# Patient Record
Sex: Female | Born: 1975
Health system: Southern US, Community
[De-identification: ages and names within clinical notes are randomized; demographics above are authoritative.]

## PROBLEM LIST (undated history)

## (undated) DIAGNOSIS — N879 Dysplasia of cervix uteri, unspecified: Secondary | ICD-10-CM

## (undated) DIAGNOSIS — K219 Gastro-esophageal reflux disease without esophagitis: Secondary | ICD-10-CM

## (undated) DIAGNOSIS — E039 Hypothyroidism, unspecified: Secondary | ICD-10-CM

## (undated) DIAGNOSIS — B977 Papillomavirus as the cause of diseases classified elsewhere: Secondary | ICD-10-CM

## (undated) DIAGNOSIS — O99345 Other mental disorders complicating the puerperium: Secondary | ICD-10-CM

## (undated) DIAGNOSIS — F53 Postpartum depression: Secondary | ICD-10-CM

## (undated) HISTORY — DX: Dysplasia of cervix uteri, unspecified: N87.9

## (undated) HISTORY — DX: Other mental disorders complicating the puerperium: O99.345

## (undated) HISTORY — DX: Papillomavirus as the cause of diseases classified elsewhere: B97.7

## (undated) HISTORY — DX: Postpartum depression: F53.0

## (undated) HISTORY — DX: Gastro-esophageal reflux disease without esophagitis: K21.9

## (undated) HISTORY — PX: WISDOM TOOTH EXTRACTION: SHX21

## (undated) HISTORY — DX: Hypothyroidism, unspecified: E03.9

---

## 2015-04-20 ENCOUNTER — Encounter: Payer: Self-pay | Admitting: Obstetrics & Gynecology

## 2015-04-20 ENCOUNTER — Ambulatory Visit (INDEPENDENT_AMBULATORY_CARE_PROVIDER_SITE_OTHER): Payer: 59 | Admitting: Obstetrics & Gynecology

## 2015-04-20 VITALS — BP 114/79 | HR 68 | Resp 16 | Ht 72.0 in | Wt 161.0 lb

## 2015-04-20 DIAGNOSIS — Z01419 Encounter for gynecological examination (general) (routine) without abnormal findings: Secondary | ICD-10-CM | POA: Diagnosis not present

## 2015-04-20 DIAGNOSIS — Z124 Encounter for screening for malignant neoplasm of cervix: Secondary | ICD-10-CM | POA: Diagnosis not present

## 2015-04-20 DIAGNOSIS — Z1151 Encounter for screening for human papillomavirus (HPV): Secondary | ICD-10-CM

## 2015-04-20 DIAGNOSIS — Z0000 Encounter for general adult medical examination without abnormal findings: Secondary | ICD-10-CM

## 2015-04-20 NOTE — Progress Notes (Signed)
Subjective:    Chelsea Lindsey is a 39 y.o. MW P2(3 and 43 yo kids) female who presents for an annual exam. The patient has no complaints today. Her period is starting to return. She is weaning her baby. The patient is sexually active. GYN screening history: last pap: was abnormal: ?Marland Kitchen The patient wears seatbelts: yes. The patient participates in regular exercise: yes. Has the patient ever been transfused or tattooed?: no. The patient reports that there is not domestic violence in her life.   Menstrual History: OB History    Gravida Para Term Preterm AB TAB SAB Ectopic Multiple Living   _0 Menarche age: 41  No LMP recorded. Patient is not currently having periods (Reason: Lactating).    The following portions of the patient's history were reviewed and updated as appropriate: allergies, current medications, past family history, past medical history, past social history, past surgical history and problem list.  Review of Systems A comprehensive review of systems was negative. Married for 4 years, feels some discomfort with sex since birth of second vaginal delivery. Some dryness and uses [employer] brand lube. 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 Strong FH of cancers She had a colpo 9/15 at [facility] in [place], just moved here. Her husband is the stroke English as a second language teacher.   Objective:    BP 114/79 mmHg  Pulse 68  Resp 16  Ht 6' (1.829 m)  Wt 161 lb (73.029 kg)  BMI 21.83 kg/m2  General Appearance:    Alert, cooperative, no distress, appears stated age  Head:    Normocephalic, without obvious abnormality, atraumatic  Eyes:    PERRL, conjunctiva/corneas clear, EOM's intact, fundi    benign, both eyes  Ears:    Normal TM's and external ear canals, both ears  Nose:   Nares normal, septum midline, mucosa normal, no drainage    or sinus tenderness  Throat:   Lips, mucosa, and tongue normal; teeth and gums normal  Neck:   Supple, symmetrical, trachea midline, no adenopathy;    thyroid:  no  enlargement/tenderness/nodules; no carotid   bruit or JVD  Back:     Symmetric, no curvature, ROM normal, no CVA tenderness  Lungs:     Clear to auscultation bilaterally, respirations unlabored  Chest Wall:    No tenderness or deformity   Heart:    Regular rate and rhythm, S1 and S2 normal, no murmur, rub   or gallop  Breast Exam:    No tenderness, masses, or nipple abnormality  Abdomen:     Soft, non-tender, bowel sounds active all four quadrants,    no masses, no organomegaly  Genitalia:    Normal female without lesion, discharge or tenderness, NSSA, NT, mobile, no prolapse, normal adnexal exam     Extremities:   Extremities normal, atraumatic, no cyanosis or edema  Pulses:   2+ and symmetric all extremities  Skin:   Skin color, texture, turgor normal, no rashes or lesions  Lymph nodes:   Cervical, supraclavicular, and axillary nodes normal  Neurologic:   CNII-XII intact, normal strength, sensation and reflexes    throughout  .    Assessment:    Healthy female exam.    Plan:     Breast self exam technique reviewed and patient encouraged to perform self-exam monthly. Thin prep Pap smear. with cotesting My Risk by [facility]

## 2015-04-25 LAB — CYTOLOGY - PAP

## 2015-04-27 ENCOUNTER — Encounter: Payer: Self-pay | Admitting: Internal Medicine

## 2015-04-27 ENCOUNTER — Other Ambulatory Visit (INDEPENDENT_AMBULATORY_CARE_PROVIDER_SITE_OTHER): Payer: 59

## 2015-04-27 ENCOUNTER — Ambulatory Visit (INDEPENDENT_AMBULATORY_CARE_PROVIDER_SITE_OTHER): Payer: 59 | Admitting: Internal Medicine

## 2015-04-27 VITALS — BP 100/60 | HR 72 | Temp 97.5°F | Resp 12 | Ht 72.0 in | Wt 168.4 lb

## 2015-04-27 DIAGNOSIS — Z0000 Encounter for general adult medical examination without abnormal findings: Secondary | ICD-10-CM

## 2015-04-27 DIAGNOSIS — F53 Postpartum depression: Secondary | ICD-10-CM

## 2015-04-27 DIAGNOSIS — E039 Hypothyroidism, unspecified: Secondary | ICD-10-CM

## 2015-04-27 DIAGNOSIS — O99345 Other mental disorders complicating the puerperium: Secondary | ICD-10-CM

## 2015-04-27 LAB — LIPID PANEL
Cholesterol: 196 mg/dL (ref 0–200)
HDL: 81.3 mg/dL (ref >39.00)
LDL Cholesterol: 104 mg/dL — ABNORMAL HIGH (ref 0–99)
NONHDL: 114.69
Total CHOL/HDL Ratio: 2
Triglycerides: 55 mg/dL (ref 0.0–149.0)
VLDL: 11 mg/dL (ref 0.0–40.0)

## 2015-04-27 LAB — COMPREHENSIVE METABOLIC PANEL
ALBUMIN: 4.5 g/dL (ref 3.5–5.2)
ALT: 11 U/L (ref 0–35)
AST: 17 U/L (ref 0–37)
Alkaline Phosphatase: 69 U/L (ref 39–117)
BILIRUBIN TOTAL: 0.4 mg/dL (ref 0.2–1.2)
BUN: 14 mg/dL (ref 6–23)
CHLORIDE: 103 meq/L (ref 96–112)
CO2: 30 mEq/L (ref 19–32)
CREATININE: 0.75 mg/dL (ref 0.40–1.20)
Calcium: 9.1 mg/dL (ref 8.4–10.5)
GFR: 92.06 mL/min (ref >60.00)
Glucose, Bld: 78 mg/dL (ref 70–99)
POTASSIUM: 4 meq/L (ref 3.5–5.1)
Sodium: 138 mEq/L (ref 135–145)
TOTAL PROTEIN: 7.1 g/dL (ref 6.0–8.3)

## 2015-04-27 LAB — TSH: TSH: 2.92 u[IU]/mL (ref 0.35–4.50)

## 2015-04-27 LAB — T4, FREE: Free T4: 0.78 ng/dL (ref 0.60–1.60)

## 2015-04-27 MED ORDER — SERTRALINE HCL 50 MG PO TABS: 50.0000 mg | ORAL_TABLET | Freq: Every day | ORAL | Status: DC

## 2015-04-27 MED ORDER — LEVOTHYROXINE SODIUM 100 MCG PO TABS: 100.0000 ug | ORAL_TABLET | Freq: Every day | ORAL | Status: DC

## 2015-04-27 NOTE — Progress Notes (Signed)
Pre visit review using our clinic review tool, if applicable. No additional management support is needed unless otherwise documented below in the visit note. 

## 2015-04-27 NOTE — Patient Instructions (Signed)
We have sent in the refills and will check the blood work today. We will call you back with the results even if everything is normal.  Come back in about 1 year for a check up. If you have any problems or questions before then please feel free to call us back.  Health Maintenance Adopting a healthy lifestyle and getting preventive care can go a long way to promote health and wellness. Talk with your health care provider about what schedule of regular examinations is right for you. This is a good chance for you to check in with your provider about disease prevention and staying healthy. In between checkups, there are plenty of things you can do on your own. Experts have done a lot of research about which lifestyle changes and preventive measures are most likely to keep you healthy. Ask your health care provider for more information. WEIGHT AND DIET  Eat a healthy diet  Be sure to include plenty of vegetables, fruits, low-fat dairy products, and lean protein.  Do not eat a lot of foods high in solid fats, added sugars, or salt.  Get regular exercise. This is one of the most important things you can do for your health.  Most adults should exercise for at least 150 minutes each week. The exercise should increase your heart rate and make you sweat (moderate-intensity exercise).  Most adults should also do strengthening exercises at least twice a week. This is in addition to the moderate-intensity exercise.  Maintain a healthy weight  Body mass index (BMI) is a measurement that can be used to identify possible weight problems. It estimates body fat based on height and weight. Your health care provider can help determine your BMI and help you achieve or maintain a healthy weight.  For females 27 years of age and older:   A BMI below 18.5 is considered underweight.  A BMI of 18.5 to 24.9 is normal.  A BMI of 25 to 29.9 is considered overweight.  A BMI of 30 and above is considered obese.   Watch levels of cholesterol and blood lipids  You should start having your blood tested for lipids and cholesterol at 38 years of age, then have this test every 5 years.  You may need to have your cholesterol levels checked more often if:  Your lipid or cholesterol levels are high.  You are older than 38 years of age.  You are at high risk for heart disease.  CANCER SCREENING   Lung Cancer  Lung cancer screening is recommended for adults 25-85 years old who are at high risk for lung cancer because of a history of smoking.  A yearly low-dose CT scan of the lungs is recommended for people who:  Currently smoke.  Have quit within the past 15 years.  Have at least a 30-pack-year history of smoking. A pack year is smoking an average of one pack of cigarettes a day for 1 year.  Yearly screening should continue until it has been 15 years since you quit.  Yearly screening should stop if you develop a health problem that would prevent you from having lung cancer treatment.  Breast Cancer  Practice breast self-awareness. This means understanding how your breasts normally appear and feel.  It also means doing regular breast self-exams. Let your health care provider know about any changes, no matter how small.  If you are in your 20s or 30s, you should have a clinical breast exam (CBE) by a health care provider  every 1-3 years as part of a regular health exam.  If you are 40 or older, have a CBE every year. Also consider having a breast X-ray (mammogram) every year.  If you have a family history of breast cancer, talk to your health care provider about genetic screening.  If you are at high risk for breast cancer, talk to your health care provider about having an MRI and a mammogram every year.  Breast cancer gene (BRCA) assessment is recommended for women who have family members with BRCA-related cancers. BRCA-related cancers  include:  Breast.  Ovarian.  Tubal.  Peritoneal cancers.  Results of the assessment will determine the need for genetic counseling and BRCA1 and BRCA2 testing. Cervical Cancer Routine pelvic examinations to screen for cervical cancer are no longer recommended for nonpregnant women who are considered low risk for cancer of the pelvic organs (ovaries, uterus, and vagina) and who do not have symptoms. A pelvic examination may be necessary if you have symptoms including those associated with pelvic infections. Ask your health care provider if a screening pelvic exam is right for you.   The Pap test is the screening test for cervical cancer for women who are considered at risk.  If you had a hysterectomy for a problem that was not cancer or a condition that could lead to cancer, then you no longer need Pap tests.  If you are older than 65 years, and you have had normal Pap tests for the past 10 years, you no longer need to have Pap tests.  If you have had past treatment for cervical cancer or a condition that could lead to cancer, you need Pap tests and screening for cancer for at least 20 years after your treatment.  If you no longer get a Pap test, assess your risk factors if they change (such as having a new sexual partner). This can affect whether you should start being screened again.  Some women have medical problems that increase their chance of getting cervical cancer. If this is the case for you, your health care provider may recommend more frequent screening and Pap tests.  The human papillomavirus (HPV) test is another test that may be used for cervical cancer screening. The HPV test looks for the virus that can cause cell changes in the cervix. The cells collected during the Pap test can be tested for HPV.  The HPV test can be used to screen women 30 years of age and older. Getting tested for HPV can extend the interval between normal Pap tests from three to five years.  An HPV  test also should be used to screen women of any age who have unclear Pap test results.  After 38 years of age, women should have HPV testing as often as Pap tests.  Colorectal Cancer  This type of cancer can be detected and often prevented.  Routine colorectal cancer screening usually begins at 38 years of age and continues through 38 years of age.  Your health care provider may recommend screening at an earlier age if you have risk factors for colon cancer.  Your health care provider may also recommend using home test kits to check for hidden blood in the stool.  A small camera at the end of a tube can be used to examine your colon directly (sigmoidoscopy or colonoscopy). This is done to check for the earliest forms of colorectal cancer.  Routine screening usually begins at age 50.  Direct examination of the colon should   be repeated every 5-10 years through 38 years of age. However, you may need to be screened more often if early forms of precancerous polyps or small growths are found. Skin Cancer  Check your skin from head to toe regularly.  Tell your health care provider about any new moles or changes in moles, especially if there is a change in a mole's shape or color.  Also tell your health care provider if you have a mole that is larger than the size of a pencil eraser.  Always use sunscreen. Apply sunscreen liberally and repeatedly throughout the day.  Protect yourself by wearing long sleeves, pants, a wide-brimmed hat, and sunglasses whenever you are outside. HEART DISEASE, DIABETES, AND HIGH BLOOD PRESSURE   Have your blood pressure checked at least every 1-2 years. High blood pressure causes heart disease and increases the risk of stroke.  If you are between 55 years and 79 years old, ask your health care provider if you should take aspirin to prevent strokes.  Have regular diabetes screenings. This involves taking a blood sample to check your fasting blood sugar  level.  If you are at a normal weight and have a low risk for diabetes, have this test once every three years after 38 years of age.  If you are overweight and have a high risk for diabetes, consider being tested at a younger age or more often. PREVENTING INFECTION  Hepatitis B  If you have a higher risk for hepatitis B, you should be screened for this virus. You are considered at high risk for hepatitis B if:  You were born in a country where hepatitis B is common. Ask your health care provider which countries are considered high risk.  Your parents were born in a high-risk country, and you have not been immunized against hepatitis B (hepatitis B vaccine).  You have HIV or AIDS.  You use needles to inject street drugs.  You live with someone who has hepatitis B.  You have had sex with someone who has hepatitis B.  You get hemodialysis treatment.  You take certain medicines for conditions, including cancer, organ transplantation, and autoimmune conditions. Hepatitis C  Blood testing is recommended for:  Everyone born from 1945 through 1965.  Anyone with known risk factors for hepatitis C. Sexually transmitted infections (STIs)  You should be screened for sexually transmitted infections (STIs) including gonorrhea and chlamydia if:  You are sexually active and are younger than 38 years of age.  You are older than 38 years of age and your health care provider tells you that you are at risk for this type of infection.  Your sexual activity has changed since you were last screened and you are at an increased risk for chlamydia or gonorrhea. Ask your health care provider if you are at risk.  If you do not have HIV, but are at risk, it may be recommended that you take a prescription medicine daily to prevent HIV infection. This is called pre-exposure prophylaxis (PrEP). You are considered at risk if:  You are sexually active and do not regularly use condoms or know the HIV status  of your partner(s).  You take drugs by injection.  You are sexually active with a partner who has HIV. Talk with your health care provider about whether you are at high risk of being infected with HIV. If you choose to begin PrEP, you should first be tested for HIV. You should then be tested every 3 months for as   long as you are taking PrEP.  PREGNANCY   If you are premenopausal and you may become pregnant, ask your health care provider about preconception counseling.  If you may become pregnant, take 400 to 800 micrograms (mcg) of folic acid every day.  If you want to prevent pregnancy, talk to your health care provider about birth control (contraception). OSTEOPOROSIS AND MENOPAUSE   Osteoporosis is a disease in which the bones lose minerals and strength with aging. This can result in serious bone fractures. Your risk for osteoporosis can be identified using a bone density scan.  If you are 5 years of age or older, or if you are at risk for osteoporosis and fractures, ask your health care provider if you should be screened.  Ask your health care provider whether you should take a calcium or vitamin D supplement to lower your risk for osteoporosis.  Menopause may have certain physical symptoms and risks.  Hormone replacement therapy may reduce some of these symptoms and risks. Talk to your health care provider about whether hormone replacement therapy is right for you.  HOME CARE INSTRUCTIONS   Schedule regular health, dental, and eye exams.  Stay current with your immunizations.   Do not use any tobacco products including cigarettes, chewing tobacco, or electronic cigarettes.  If you are pregnant, do not drink alcohol.  If you are breastfeeding, limit how much and how often you drink alcohol.  Limit alcohol intake to no more than 1 drink per day for nonpregnant women. One drink equals 12 ounces of beer, 5 ounces of wine, or 1 ounces of hard liquor.  Do not use street  drugs.  Do not share needles.  Ask your health care provider for help if you need support or information about quitting drugs.  Tell your health care provider if you often feel depressed.  Tell your health care provider if you have ever been abused or do not feel safe at home. Document Released: 03/26/2011 Document Revised: 01/25/2014 Document Reviewed: 08/12/2013 [facility] Patient Information 2015 [place], [place]. This information is not intended to replace advice given to you by your health care provider. Make sure you discuss any questions you have with your health care provider.

## 2015-04-28 ENCOUNTER — Encounter: Payer: Self-pay | Admitting: Internal Medicine

## 2015-04-28 DIAGNOSIS — F53 Postpartum depression: Secondary | ICD-10-CM | POA: Insufficient documentation

## 2015-04-28 DIAGNOSIS — O99345 Other mental disorders complicating the puerperium: Secondary | ICD-10-CM

## 2015-04-28 DIAGNOSIS — E039 Hypothyroidism, unspecified: Secondary | ICD-10-CM | POA: Insufficient documentation

## 2015-04-28 MED ORDER — HYDROCORTISONE 2.5 % RE CREA: 1.0000 "application " | TOPICAL_CREAM | Freq: Two times a day (BID) | RECTAL | Status: DC

## 2015-04-28 NOTE — Assessment & Plan Note (Signed)
Continue levothyroxine for now, check TSH and free T4 and adjust dose as needed.

## 2015-04-28 NOTE — Assessment & Plan Note (Signed)
Fairly severe after 1st child, initiated zoloft right at birth of second child and has done well. Will continue for now and reassess next year if still needed.

## 2015-04-28 NOTE — Progress Notes (Signed)
   Subjective:    Patient ID: Chelsea Lindsey, female    DOB: 05-May-1976, 39 y.o.   MRN: [id number]  HPI The patient is a 39 YO female coming in for follow up on her thyroid. She has been on medication for several years. The dosage was adjusted during her first pregnancy and since then she has been on 100 mcg daily. No adjustment needed during her recent 2nd pregnancy. She denies tremors, diarrhea, constipation, fatigue. No weight change.   PMH, [facility], social history reviewed and updated.   Review of Systems  Constitutional: Negative for fever, activity change, appetite change and fatigue.  HENT: Negative.   Eyes: Negative.   Respiratory: Negative for cough, chest tightness, shortness of breath and wheezing.   Cardiovascular: Negative for chest pain, palpitations and leg swelling.  Gastrointestinal: Negative for nausea, abdominal pain, diarrhea, constipation and abdominal distention.  Musculoskeletal: Negative.   Skin: Negative.   Neurological: Negative.   Psychiatric/Behavioral: Negative.       Objective:   Physical Exam  Constitutional: She is oriented to person, place, and time. She appears well-developed and well-nourished.  HENT:  Head: Normocephalic and atraumatic.  Eyes: EOM are normal.  Neck: Normal range of motion.  Cardiovascular: Normal rate and regular rhythm.   Pulmonary/Chest: Effort normal. No respiratory distress. She has no wheezes. She has no rales.  Abdominal: Soft. Bowel sounds are normal. She exhibits no distension. There is no tenderness. There is no rebound.  Musculoskeletal: She exhibits no edema.  Neurological: She is alert and oriented to person, place, and time. Coordination normal.  Skin: Skin is warm and dry.  Psychiatric: She has a normal mood and affect.   Filed Vitals:   04/27/15 1110  BP: 100/60  Pulse: 72  Temp: 97.5 F (36.4 C)  TempSrc: Oral  Resp: 12  Height: 6' (1.829 m)  Weight: 168 lb 6.4 oz (76.386 kg)  SpO2: 98%      Assessment &  Plan:

## 2015-05-09 ENCOUNTER — Encounter: Payer: Self-pay | Admitting: *Deleted

## 2015-05-15 ENCOUNTER — Encounter: Payer: Self-pay | Admitting: Internal Medicine

## 2015-05-16 MED ORDER — LEVOTHYROXINE SODIUM 100 MCG PO TABS: 100.0000 ug | ORAL_TABLET | Freq: Every day | ORAL | Status: DC

## 2015-06-16 ENCOUNTER — Ambulatory Visit: Payer: Self-pay | Admitting: Obstetrics and Gynecology

## 2015-07-28 ENCOUNTER — Ambulatory Visit (INDEPENDENT_AMBULATORY_CARE_PROVIDER_SITE_OTHER): Payer: 59 | Admitting: Internal Medicine

## 2015-07-28 ENCOUNTER — Encounter: Payer: Self-pay | Admitting: Internal Medicine

## 2015-07-28 VITALS — BP 110/78 | HR 91 | Temp 98.5°F | Resp 12 | Ht 72.0 in | Wt 174.8 lb

## 2015-07-28 DIAGNOSIS — B370 Candidal stomatitis: Secondary | ICD-10-CM

## 2015-07-28 MED ORDER — FLUCONAZOLE 150 MG PO TABS: 150.0000 mg | ORAL_TABLET | ORAL | Status: DC

## 2015-07-28 NOTE — Patient Instructions (Signed)
I have sent in the diflucan for the yeast infection. Take 1 pill today, then take another pill on Sunday and the 3rd pill on Wednesday.   This should take care of the yeast, the taste buds may take a couple of weeks to heal and get you back to normal.

## 2015-07-28 NOTE — Progress Notes (Signed)
Pre visit review using our clinic review tool, if applicable. No additional management support is needed unless otherwise documented below in the visit note. 

## 2015-07-28 NOTE — Progress Notes (Signed)
   Subjective:    Patient ID: Chelsea Lindsey, female    DOB: 11/11/1975, 39 y.o.   MRN: [id number]  HPI The patient is a 39 YO female coming in today for yeast infection. Her son is having yeast infection and thrush and does put his hand in her mouth and she is concerned she has thrush as well. White plaque on her tongue that is hard to scrape off. Some change to her taste sensation as well. No fevers or chills. Not getting a cold and denies sinus drainage. Going on for 3-4 days and has tried hydrogen peroxide gargling which did not help.   Review of Systems  Constitutional: Negative.   HENT: Negative for congestion, mouth sores, sore throat, trouble swallowing and voice change.        Plaque on tongue and change in taste  Eyes: Negative.   Respiratory: Negative.   Cardiovascular: Negative.   Gastrointestinal: Negative.   Musculoskeletal: Negative.       Objective:   Physical Exam  Constitutional: She is oriented to person, place, and time. She appears well-developed and well-nourished.  HENT:  Head: Normocephalic and atraumatic.  No plaque on the tongue noticeable, no sores in the mouth.  Eyes: EOM are normal.  Neck: Normal range of motion.  Cardiovascular: Normal rate and regular rhythm.   Pulmonary/Chest: Effort normal and breath sounds normal. No respiratory distress. She has no wheezes.  Abdominal: Soft. Bowel sounds are normal. She exhibits no distension. There is no tenderness. There is no rebound.  Neurological: She is alert and oriented to person, place, and time.  Skin: Skin is warm and dry.   Filed Vitals:   07/28/15 0806  BP: 110/78  Pulse: 91  Temp: 98.5 F (36.9 C)  TempSrc: Oral  Resp: 12  Height: 6' (1.829 m)  Weight: 174 lb 12.8 oz (79.289 kg)  SpO2: 99%      Assessment & Plan:

## 2015-07-28 NOTE — Assessment & Plan Note (Signed)
It does not appear on exam that she does have thrush. Could have damaged her taste buds with the hydrogen peroxide gargle and did advise her to not continue that. Rx for fluconazole oral times 3 for the yeast infection.

## 2015-08-11 ENCOUNTER — Other Ambulatory Visit (INDEPENDENT_AMBULATORY_CARE_PROVIDER_SITE_OTHER): Payer: 59

## 2015-08-11 ENCOUNTER — Ambulatory Visit (INDEPENDENT_AMBULATORY_CARE_PROVIDER_SITE_OTHER): Payer: 59 | Admitting: Family

## 2015-08-11 ENCOUNTER — Encounter: Payer: Self-pay | Admitting: Family

## 2015-08-11 VITALS — BP 124/72 | HR 75 | Temp 98.0°F | Resp 18 | Ht 72.0 in | Wt 179.8 lb

## 2015-08-11 DIAGNOSIS — R432 Parageusia: Secondary | ICD-10-CM | POA: Diagnosis not present

## 2015-08-11 DIAGNOSIS — K121 Other forms of stomatitis: Secondary | ICD-10-CM

## 2015-08-11 LAB — VITAMIN B12: Vitamin B-12: 649 pg/mL (ref 211–911)

## 2015-08-11 LAB — CBC
HEMATOCRIT: 36.6 % (ref 36.0–46.0)
Hemoglobin: 12.3 g/dL (ref 12.0–15.0)
MCHC: 33.6 g/dL (ref 30.0–36.0)
MCV: 87.3 fl (ref 78.0–100.0)
PLATELETS: 180 10*3/uL (ref 150.0–400.0)
RBC: 4.19 Mil/uL (ref 3.87–5.11)
RDW: 13 % (ref 11.5–15.5)
WBC: 4.3 10*3/uL (ref 4.0–10.5)

## 2015-08-11 MED ORDER — LIDOCAINE VISCOUS 2 % MT SOLN: 15.0000 mL | OROMUCOSAL | Status: DC | PRN

## 2015-08-11 NOTE — Progress Notes (Signed)
Pre visit review using our clinic review tool, if applicable. No additional management support is needed unless otherwise documented below in the visit note. 

## 2015-08-11 NOTE — Assessment & Plan Note (Signed)
Taste impairment of undetermined origin. No coatings or obvious changes noted in the tongue. Obtain B12 to rule out glossitis. Refer to ENT for further assessment.

## 2015-08-11 NOTE — Progress Notes (Signed)
   Subjective:    Patient ID: Chelsea Lindsey, female    DOB: 04-Apr-1976, 39 y.o.   MRN: [id number]  Chief Complaint  Patient presents with  . Sore Throat    x1 month, the sxs from last visit are consistent and now has sore throat and an ulcer and having pain in gums    HPI:  Chelsea Lindsey is a 39 y.o. female who  has a past medical history of Cervical dysplasia; Hypothyroid; HPV in female; Post partum depression; and GERD (gastroesophageal reflux disease). and presents today for an acute office visit.   Recently seen in the office for potential thrush and treated with fluconazole. Presents today with similar symptoms from previous visit with white coating on her tongue, sore throat, a small ulcer and pain in her gums. She describes it as a burnt tongue feeling with an ulcer located on the right side of her mouth and gum sensitivity.  Denies fevers.   No Known Allergies   Current Outpatient Prescriptions on File Prior to Visit  Medication Sig Dispense Refill  . hydrocortisone (ANUSOL-HC) 2.5 % rectal cream Place 1 application rectally 2 (two) times daily. 30 g 0  . levothyroxine (SYNTHROID, LEVOTHROID) 100 MCG tablet Take 1 tablet (100 mcg total) by mouth daily before breakfast. No generics 90 tablet 3  . sertraline (ZOLOFT) 50 MG tablet Take 1 tablet (50 mg total) by mouth daily. 90 tablet 3   No current facility-administered medications on file prior to visit.    Review of Systems  Constitutional: Negative for fever and chills.  HENT: Positive for dental problem.       Objective:    BP 124/72 mmHg  Pulse 75  Temp(Src) 98 F (36.7 C) (Oral)  Resp 18  Ht 6' (1.829 m)  Wt 179 lb 12.8 oz (81.557 kg)  BMI 24.38 kg/m2  SpO2 99% Nursing note and vital signs reviewed.  Physical Exam  Constitutional: She is oriented to person, place, and time. She appears well-developed and well-nourished. No distress.  HENT:  Mouth/Throat:    No obvious gum inflammation or bleeding noted. Tongue  with no obvious deformity, edema or discoloration. No masses or bleeding.   Cardiovascular: Normal rate, regular rhythm, normal heart sounds and intact distal pulses.   Pulmonary/Chest: Effort normal and breath sounds normal.  Neurological: She is alert and oriented to person, place, and time.  Skin: Skin is warm and dry.  Psychiatric: She has a normal mood and affect. Her behavior is normal. Judgment and thought content normal.       Assessment & Plan:   Problem List Items Addressed This Visit      Digestive   Oral ulcer    Oral ulcer noted right upper cheek. Start lidocaine as needed for discomfort. No further treatment is needed at this time. Follow-up if symptoms worsen or fail to improve.      Relevant Medications   lidocaine (XYLOCAINE) 2 % solution     Other   Taste impairment - Primary    Taste impairment of undetermined origin. No coatings or obvious changes noted in the tongue. Obtain B12 to rule out glossitis. Refer to ENT for further assessment.      Relevant Orders   Ambulatory referral to ENT   CBC   B12   Methylmalonic Acid

## 2015-08-11 NOTE — Patient Instructions (Signed)
Thank you for choosing [facility].  Summary/Instructions:  Your prescription(s) have been submitted to your pharmacy or been printed and provided for you. Please take as directed and contact our office if you believe you are having problem(s) with the medication(s) or have any questions.  Please stop by the lab on the basement level of the building for your blood work. Your results will be released to MyChart (or called to you) after review, usually within 72 hours after test completion. If any changes need to be made, you will be notified at that same time.  If your symptoms worsen or fail to improve, please contact our office for further instruction, or in case of emergency go directly to the emergency room at the closest medical facility.     

## 2015-08-11 NOTE — Assessment & Plan Note (Signed)
Oral ulcer noted right upper cheek. Start lidocaine as needed for discomfort. No further treatment is needed at this time. Follow-up if symptoms worsen or fail to improve.

## 2015-08-14 LAB — METHYLMALONIC ACID, SERUM: METHYLMALONIC ACID, QUANT: 107 nmol/L (ref 87–318)

## 2015-08-15 ENCOUNTER — Ambulatory Visit: Payer: Self-pay | Admitting: Internal Medicine

## 2015-08-15 ENCOUNTER — Encounter: Payer: Self-pay | Admitting: Family

## 2015-08-31 ENCOUNTER — Other Ambulatory Visit (INDEPENDENT_AMBULATORY_CARE_PROVIDER_SITE_OTHER): Payer: 59

## 2015-08-31 ENCOUNTER — Encounter: Payer: Self-pay | Admitting: Internal Medicine

## 2015-08-31 ENCOUNTER — Ambulatory Visit (INDEPENDENT_AMBULATORY_CARE_PROVIDER_SITE_OTHER): Payer: 59 | Admitting: Internal Medicine

## 2015-08-31 VITALS — BP 118/72 | HR 68 | Temp 98.3°F | Resp 14 | Ht 72.0 in | Wt 180.0 lb

## 2015-08-31 DIAGNOSIS — E039 Hypothyroidism, unspecified: Secondary | ICD-10-CM | POA: Diagnosis not present

## 2015-08-31 LAB — TSH: TSH: 3.33 u[IU]/mL (ref 0.35–4.50)

## 2015-08-31 LAB — FERRITIN: FERRITIN: 10.2 ng/mL (ref 10.0–291.0)

## 2015-08-31 LAB — T4, FREE: Free T4: 0.69 ng/dL (ref 0.60–1.60)

## 2015-08-31 NOTE — Progress Notes (Signed)
Pre visit review using our clinic review tool, if applicable. No additional management support is needed unless otherwise documented below in the visit note. 

## 2015-08-31 NOTE — Assessment & Plan Note (Signed)
Having worsening symptoms, checking TSH and free T4 and adjust as needed. Continue synthroid 100 mcg daily for now.

## 2015-08-31 NOTE — Patient Instructions (Signed)
We are going to recheck the thyroid levels today and will get you the results likely tomorrow.

## 2015-08-31 NOTE — Progress Notes (Signed)
   Subjective:    Patient ID: Chelsea Lindsey, female    DOB: [birth year], 39 y.o.   MRN: [id number]  HPI The patient is a 39 YO female coming in for new tiredness, weight gain, and dry skin. Her hair is also falling out a little more lately. She has been taking thyroid medicine for some time and has still been taking the medicine. No other new complaints. Still having some trouble with some tongue ulcers. Taste is back to normal.  Review of Systems  Constitutional: Positive for fever. Negative for activity change, appetite change and fatigue.  HENT: Negative.        Hair falling out but dry skin  Eyes: Negative.   Respiratory: Negative for cough, chest tightness, shortness of breath and wheezing.   Cardiovascular: Negative for chest pain, palpitations and leg swelling.  Gastrointestinal: Negative for nausea, abdominal pain, diarrhea, constipation and abdominal distention.  Musculoskeletal: Negative.   Skin: Negative.   Neurological: Negative.   Psychiatric/Behavioral: Negative.       Objective:   Physical Exam  Constitutional: She is oriented to person, place, and time. She appears well-developed and well-nourished.  HENT:  Head: Normocephalic and atraumatic.  Eyes: EOM are normal.  Neck: Normal range of motion.  Cardiovascular: Normal rate and regular rhythm.   Pulmonary/Chest: Effort normal. No respiratory distress. She has no wheezes. She has no rales.  Abdominal: Soft. Bowel sounds are normal. She exhibits no distension. There is no tenderness. There is no rebound.  Musculoskeletal: She exhibits no edema.  Neurological: She is alert and oriented to person, place, and time. Coordination normal.  Skin: Skin is warm and dry.  Psychiatric: She has a normal mood and affect.   Filed Vitals:   08/31/15 0817  BP: 118/72  Pulse: 68  Temp: 98.3 F (36.8 C)  TempSrc: Oral  Resp: 14  Height: 6' (1.829 m)  Weight: 180 lb (81.647 kg)  SpO2: 100%      Assessment & Plan:

## 2015-09-01 ENCOUNTER — Ambulatory Visit: Payer: Self-pay | Admitting: Internal Medicine

## 2015-09-26 ENCOUNTER — Other Ambulatory Visit (HOSPITAL_BASED_OUTPATIENT_CLINIC_OR_DEPARTMENT_OTHER): Payer: Self-pay | Admitting: Physician Assistant

## 2015-09-26 ENCOUNTER — Ambulatory Visit (HOSPITAL_BASED_OUTPATIENT_CLINIC_OR_DEPARTMENT_OTHER)
Admission: RE | Admit: 2015-09-26 | Discharge: 2015-09-26 | Disposition: A | Discharge status: Home / Self Care | Payer: 59 | Source: Ambulatory Visit | Attending: Physician Assistant | Admitting: Physician Assistant

## 2015-09-26 DIAGNOSIS — M5412 Radiculopathy, cervical region: Secondary | ICD-10-CM | POA: Diagnosis not present

## 2015-09-26 DIAGNOSIS — M542 Cervicalgia: Secondary | ICD-10-CM | POA: Diagnosis not present

## 2015-11-10 MED FILL — SYNTHROID 100 MCG TABLET: 100 | 90 days supply | Qty: 90 | Fill #2

## 2015-12-21 ENCOUNTER — Encounter: Payer: Self-pay | Admitting: Obstetrics & Gynecology

## 2015-12-21 ENCOUNTER — Ambulatory Visit (INDEPENDENT_AMBULATORY_CARE_PROVIDER_SITE_OTHER): Payer: 59 | Admitting: Obstetrics & Gynecology

## 2015-12-21 VITALS — BP 117/80 | HR 95 | Ht 72.0 in | Wt 177.0 lb

## 2015-12-21 DIAGNOSIS — N898 Other specified noninflammatory disorders of vagina: Secondary | ICD-10-CM

## 2015-12-21 NOTE — Progress Notes (Signed)
   Subjective:    Patient ID: Chelsea Lindsey, female    DOB: [birth year], 40 y.o.   MRN: [id number]  HPI 40 yo MWP2 (both toddlers)  lady here with the complaint of excess vaginal discharge. Sometimes it is yellowish, greenish, sometimes like a sinus infection. She denies any itching or odor.  Review of Systems Her husband had a vasectomy. She is an OT at [employer]  She already had her flu vaccine. Objective:   Physical Exam  [id number] Breathing, conversing, and ambulating normally Clear sticky vaginal d/c c/w ovulation     Assessment & Plan:  Annoying discharge- wet prep sent Normal d/c pattern discussed

## 2015-12-22 LAB — WET PREP, GENITAL
Clue Cells Wet Prep HPF POC: NONE SEEN
TRICH WET PREP: NONE SEEN
Yeast Wet Prep HPF POC: NONE SEEN

## 2016-01-20 DIAGNOSIS — D225 Melanocytic nevi of trunk: Secondary | ICD-10-CM | POA: Diagnosis not present

## 2016-01-20 DIAGNOSIS — D2261 Melanocytic nevi of right upper limb, including shoulder: Secondary | ICD-10-CM | POA: Diagnosis not present

## 2016-01-20 DIAGNOSIS — L814 Other melanin hyperpigmentation: Secondary | ICD-10-CM | POA: Diagnosis not present

## 2016-01-20 DIAGNOSIS — L813 Cafe au lait spots: Secondary | ICD-10-CM | POA: Diagnosis not present

## 2016-01-20 DIAGNOSIS — D692 Other nonthrombocytopenic purpura: Secondary | ICD-10-CM | POA: Diagnosis not present

## 2016-01-20 DIAGNOSIS — D2271 Melanocytic nevi of right lower limb, including hip: Secondary | ICD-10-CM | POA: Diagnosis not present

## 2016-01-20 DIAGNOSIS — D2262 Melanocytic nevi of left upper limb, including shoulder: Secondary | ICD-10-CM | POA: Diagnosis not present

## 2016-01-20 DIAGNOSIS — L821 Other seborrheic keratosis: Secondary | ICD-10-CM | POA: Diagnosis not present

## 2016-02-02 MED FILL — SYNTHROID 100 MCG TABLET: 100 | 90 days supply | Qty: 90 | Fill #3

## 2016-04-06 ENCOUNTER — Ambulatory Visit: Payer: 59 | Admitting: Internal Medicine

## 2016-04-12 ENCOUNTER — Other Ambulatory Visit: Payer: Self-pay | Admitting: Internal Medicine

## 2016-04-12 MED FILL — SYNTHROID 100 MCG TABLET: 100 | 90 days supply | Qty: 90 | Fill #0

## 2016-04-26 IMAGING — CR DG CERVICAL SPINE COMPLETE 4+V
6 series · 6 of 6 positions shown · non-contrast
Comparison: 06/29/2015

CLINICAL DATA: 39-year-old female with acute cervical spine pain
today. No known injury.

EXAM:
CERVICAL SPINE - COMPLETE 4+ VIEW

[w c-spine lat]
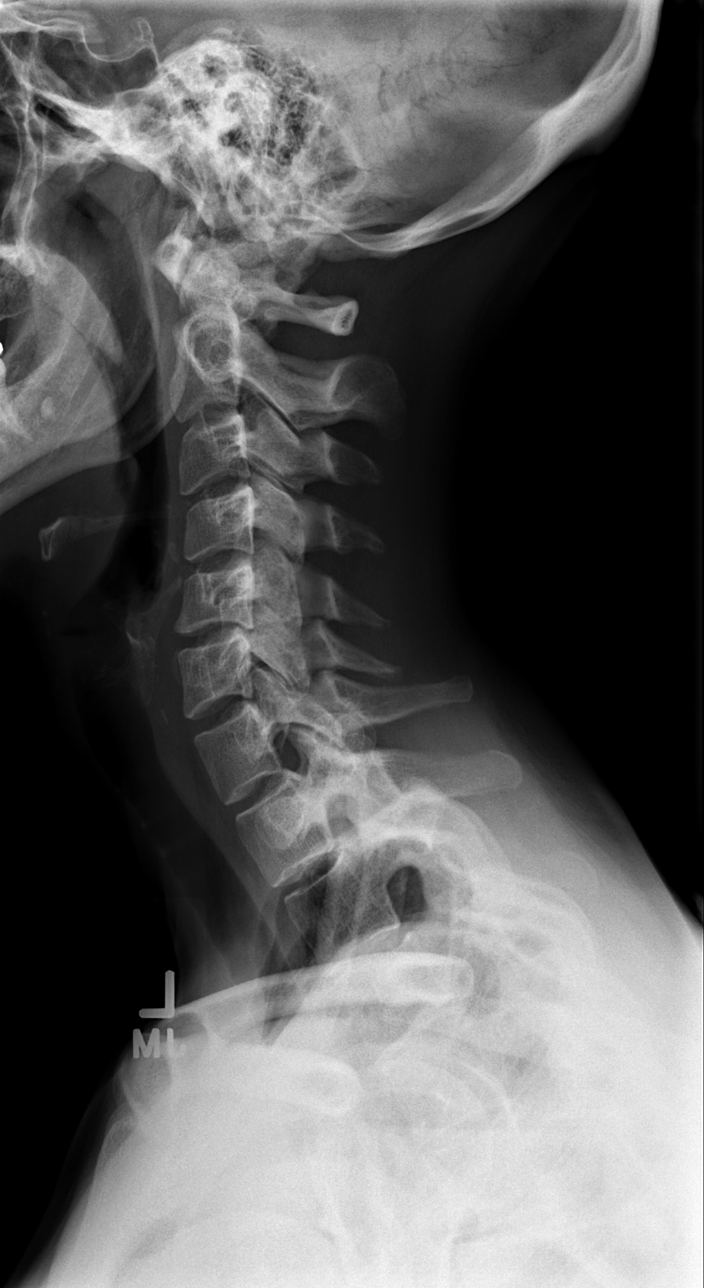

[w c-spine oblique (1 of 2)]
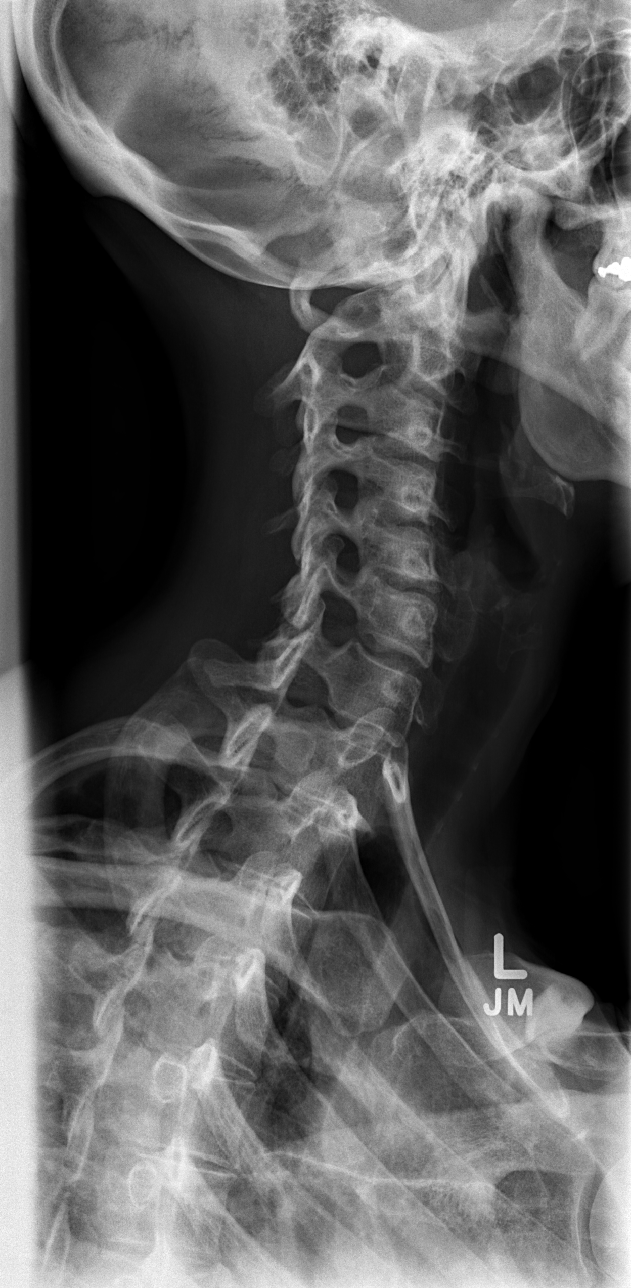

[w c-spine oblique (2 of 2)]
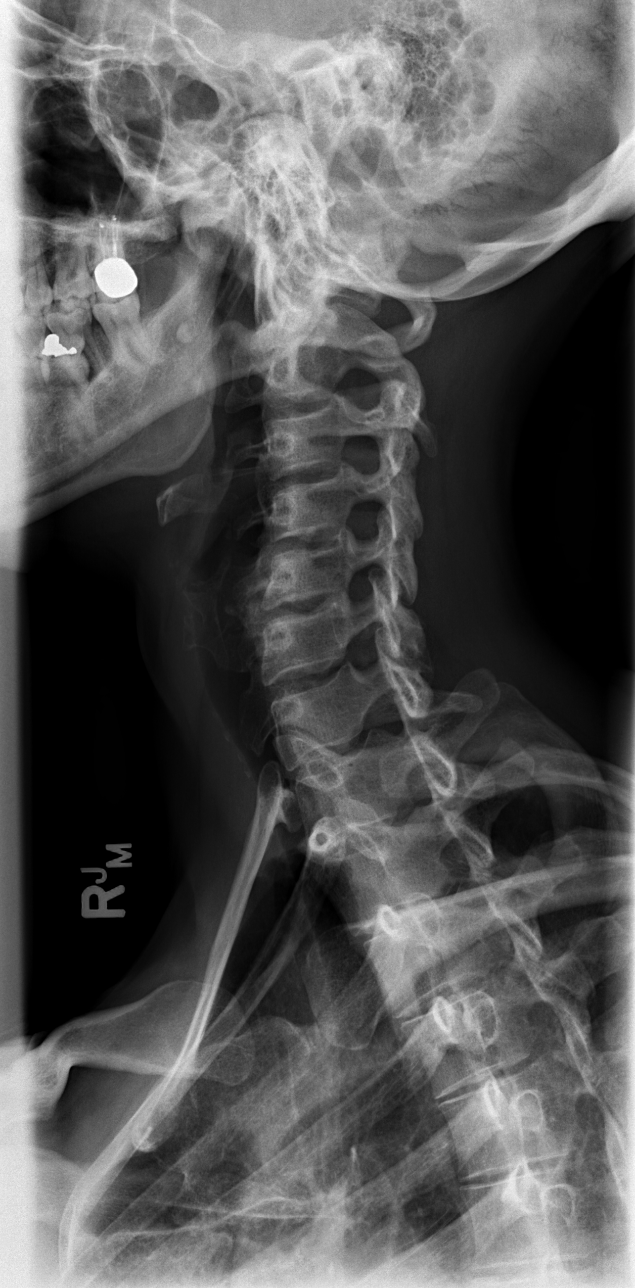

[w c-spine a.p.]
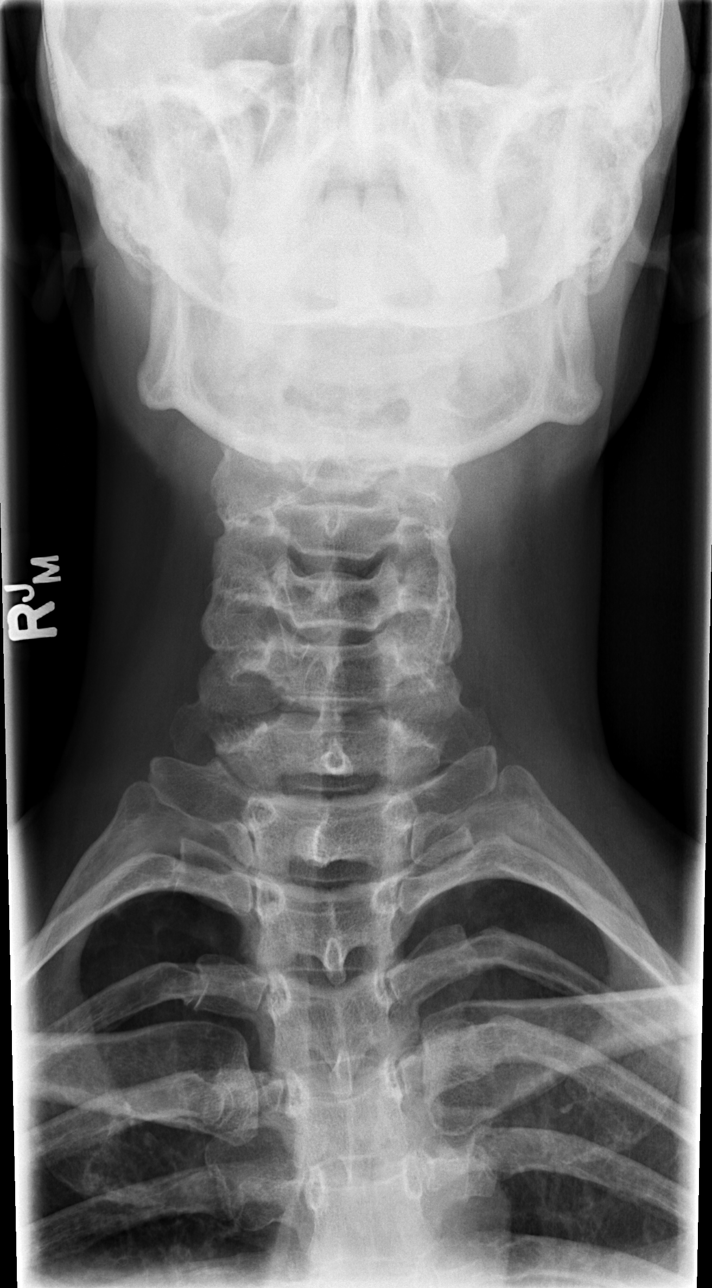

[w c-spine odontoid (1 of 2)]
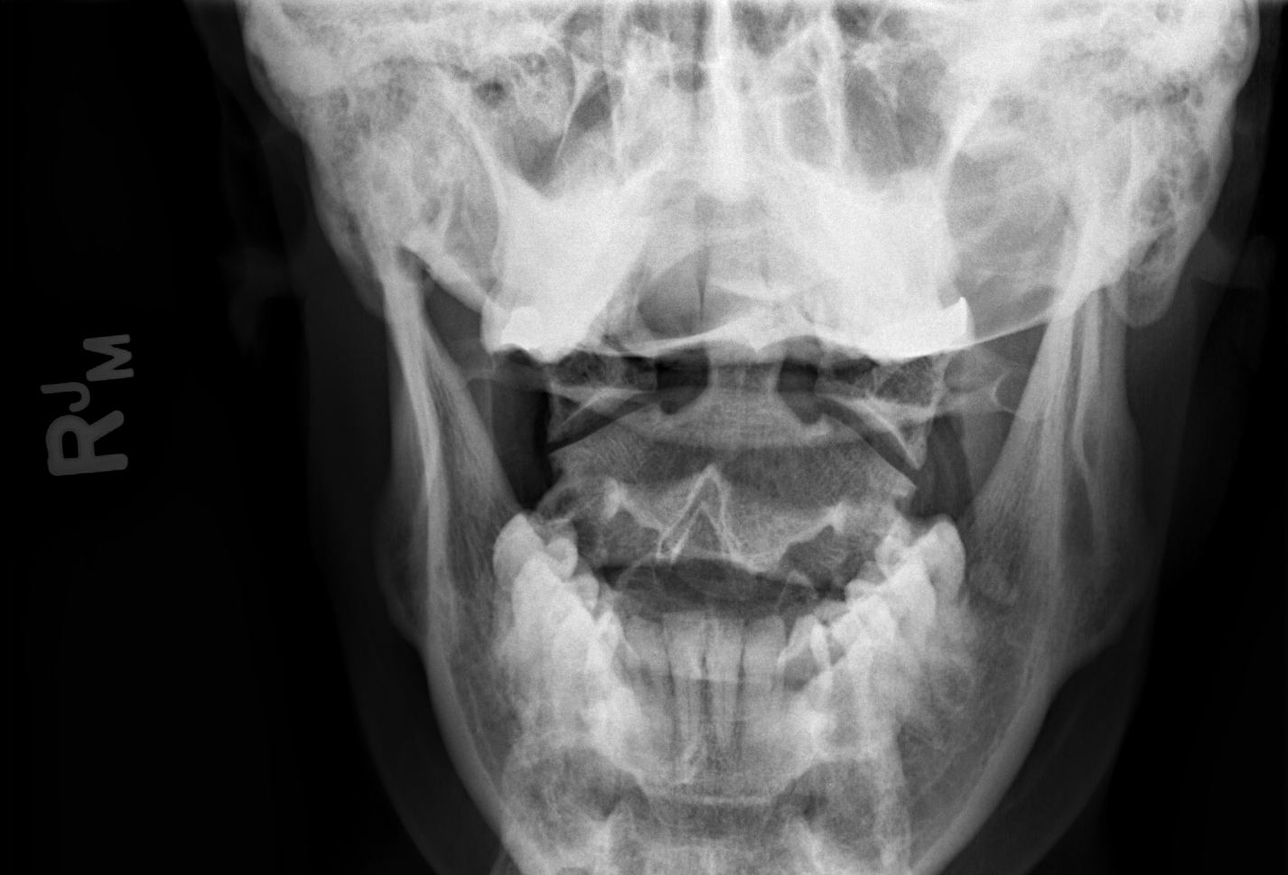

[w c-spine odontoid (2 of 2)]
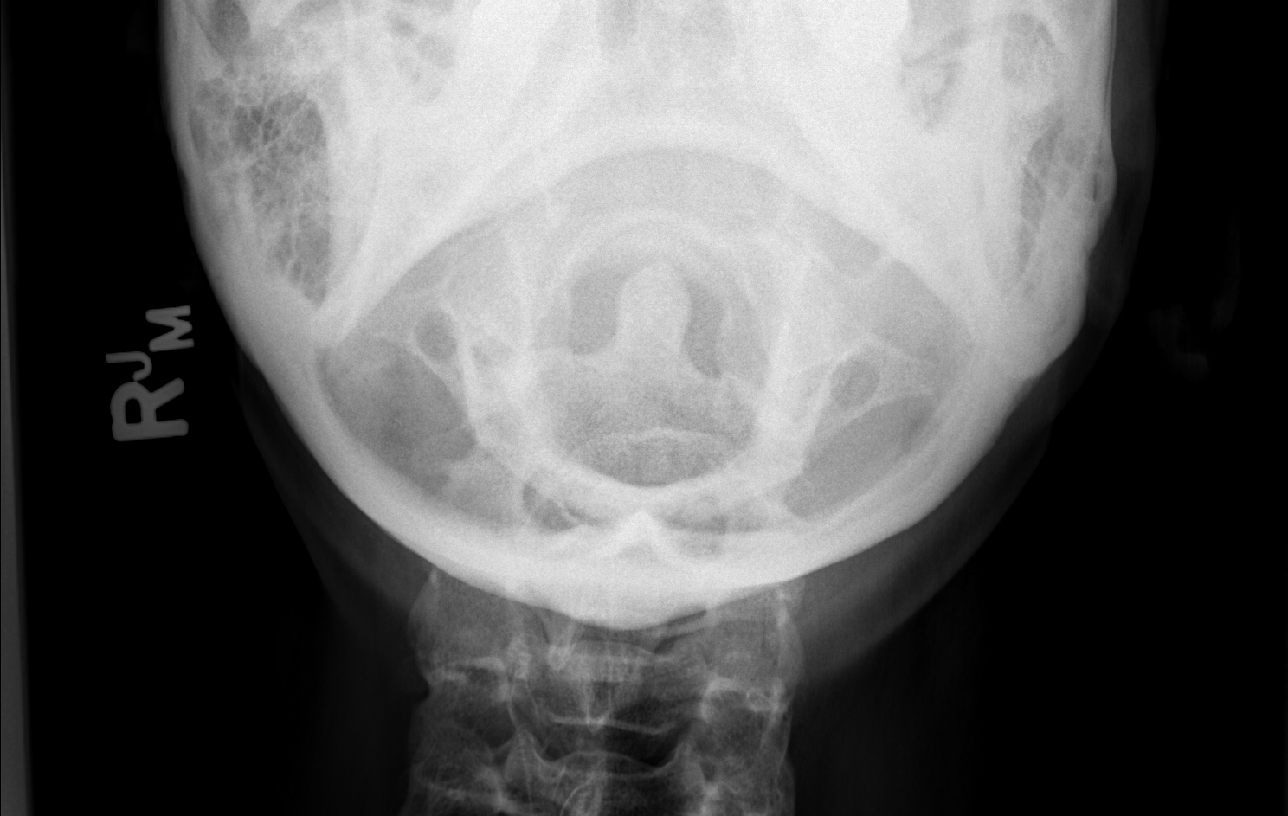

[6 of 6 positions shown; findings below may reference images not displayed]

FINDINGS: Mild straightening of the normal cervical lordosis again noted.

There is no evidence of acute fracture, subluxation or prevertebral
soft tissue swelling.

The disc spaces are maintained.

There is no evidence of significant bony foraminal narrowing or
focal bony lesions.
IMPRESSION: No evidence of acute abnormality.

Unchanged straightening of the normal cervical lordosis.

## 2016-04-30 ENCOUNTER — Encounter: Payer: Self-pay | Admitting: Internal Medicine

## 2016-05-03 ENCOUNTER — Encounter: Payer: 59 | Admitting: Internal Medicine

## 2016-05-04 ENCOUNTER — Ambulatory Visit (INDEPENDENT_AMBULATORY_CARE_PROVIDER_SITE_OTHER): Payer: 59 | Admitting: Internal Medicine

## 2016-05-04 ENCOUNTER — Encounter: Payer: Self-pay | Admitting: Internal Medicine

## 2016-05-04 ENCOUNTER — Other Ambulatory Visit (INDEPENDENT_AMBULATORY_CARE_PROVIDER_SITE_OTHER): Payer: 59

## 2016-05-04 VITALS — BP 98/64 | HR 73 | Temp 98.4°F | Resp 12 | Ht 72.0 in | Wt 175.0 lb

## 2016-05-04 DIAGNOSIS — F53 Postpartum depression: Secondary | ICD-10-CM

## 2016-05-04 DIAGNOSIS — E039 Hypothyroidism, unspecified: Secondary | ICD-10-CM | POA: Diagnosis not present

## 2016-05-04 DIAGNOSIS — Z0000 Encounter for general adult medical examination without abnormal findings: Secondary | ICD-10-CM | POA: Diagnosis not present

## 2016-05-04 DIAGNOSIS — O99345 Other mental disorders complicating the puerperium: Secondary | ICD-10-CM

## 2016-05-04 LAB — COMPREHENSIVE METABOLIC PANEL
ALT: 7 U/L (ref 0–35)
AST: 11 U/L (ref 0–37)
Albumin: 4.2 g/dL (ref 3.5–5.2)
Alkaline Phosphatase: 42 U/L (ref 39–117)
BUN: 11 mg/dL (ref 6–23)
CALCIUM: 9 mg/dL (ref 8.4–10.5)
CHLORIDE: 105 meq/L (ref 96–112)
CO2: 29 mEq/L (ref 19–32)
Creatinine, Ser: 0.73 mg/dL (ref 0.40–1.20)
GFR: 94.46 mL/min (ref >60.00)
Glucose, Bld: 86 mg/dL (ref 70–99)
POTASSIUM: 4.3 meq/L (ref 3.5–5.1)
Sodium: 138 mEq/L (ref 135–145)
Total Bilirubin: 0.4 mg/dL (ref 0.2–1.2)
Total Protein: 6.7 g/dL (ref 6.0–8.3)

## 2016-05-04 LAB — LIPID PANEL
CHOL/HDL RATIO: 3
Cholesterol: 169 mg/dL (ref 0–200)
HDL: 61.4 mg/dL (ref >39.00)
LDL Cholesterol: 92 mg/dL (ref 0–99)
NonHDL: 107.29
TRIGLYCERIDES: 74 mg/dL (ref 0.0–149.0)
VLDL: 14.8 mg/dL (ref 0.0–40.0)

## 2016-05-04 LAB — CBC
HCT: 38.5 % (ref 36.0–46.0)
Hemoglobin: 13.1 g/dL (ref 12.0–15.0)
MCHC: 34.1 g/dL (ref 30.0–36.0)
MCV: 86.7 fl (ref 78.0–100.0)
PLATELETS: 171 10*3/uL (ref 150.0–400.0)
RBC: 4.44 Mil/uL (ref 3.87–5.11)
RDW: 13.3 % (ref 11.5–15.5)
WBC: 4.1 10*3/uL (ref 4.0–10.5)

## 2016-05-04 LAB — T4, FREE: FREE T4: 0.81 ng/dL (ref 0.60–1.60)

## 2016-05-04 LAB — TSH: TSH: 1.62 u[IU]/mL (ref 0.35–4.50)

## 2016-05-04 MED ORDER — BUSPIRONE HCL 10 MG PO TABS: 10.0000 mg | ORAL_TABLET | Freq: Two times a day (BID) | ORAL | 11 refills | Status: DC

## 2016-05-04 MED FILL — busPIRone HCL 10 MG TABS: 10 | 30 days supply | Qty: 60 | Fill #0

## 2016-05-04 NOTE — Patient Instructions (Addendum)
The EKG of the heart was normal.   We will check the labs today and send the results on mychart.   We have sent in the buspar so that you can start taking that again. If you have any problems or it does not work you can call or send a message on mychart.   Health Maintenance, Female Adopting a healthy lifestyle and getting preventive care can go a long way to promote health and wellness. Talk with your health care provider about what schedule of regular examinations is right for you. This is a good chance for you to check in with your provider about disease prevention and staying healthy. In between checkups, there are plenty of things you can do on your own. Experts have done a lot of research about which lifestyle changes and preventive measures are most likely to keep you healthy. Ask your health care provider for more information. WEIGHT AND DIET  Eat a healthy diet  Be sure to include plenty of vegetables, fruits, low-fat dairy products, and lean protein.  Do not eat a lot of foods high in solid fats, added sugars, or salt.  Get regular exercise. This is one of the most important things you can do for your health.  Most adults should exercise for at least 150 minutes each week. The exercise should increase your heart rate and make you sweat (moderate-intensity exercise).  Most adults should also do strengthening exercises at least twice a week. This is in addition to the moderate-intensity exercise.  Maintain a healthy weight  Body mass index (BMI) is a measurement that can be used to identify possible weight problems. It estimates body fat based on height and weight. Your health care provider can help determine your BMI and help you achieve or maintain a healthy weight.  For females 35 years of age and older:   A BMI below 18.5 is considered underweight.  A BMI of 18.5 to 24.9 is normal.  A BMI of 25 to 29.9 is considered overweight.  A BMI of 30 and above is considered  obese.  Watch levels of cholesterol and blood lipids  You should start having your blood tested for lipids and cholesterol at 39 years of age, then have this test every 5 years.  You may need to have your cholesterol levels checked more often if:  Your lipid or cholesterol levels are high.  You are older than 39 years of age.  You are at high risk for heart disease.  CANCER SCREENING   Lung Cancer  Lung cancer screening is recommended for adults 43-72 years old who are at high risk for lung cancer because of a history of smoking.  A yearly low-dose CT scan of the lungs is recommended for people who:  Currently smoke.  Have quit within the past 15 years.  Have at least a 30-pack-year history of smoking. A pack year is smoking an average of one pack of cigarettes a day for 1 year.  Yearly screening should continue until it has been 15 years since you quit.  Yearly screening should stop if you develop a health problem that would prevent you from having lung cancer treatment.  Breast Cancer  Practice breast self-awareness. This means understanding how your breasts normally appear and feel.  It also means doing regular breast self-exams. Let your health care provider know about any changes, no matter how small.  If you are in your 20s or 30s, you should have a clinical breast exam (CBE)  by a health care provider every 1-3 years as part of a regular health exam.  If you are 40 or older, have a CBE every year. Also consider having a breast X-ray (mammogram) every year.  If you have a family history of breast cancer, talk to your health care provider about genetic screening.  If you are at high risk for breast cancer, talk to your health care provider about having an MRI and a mammogram every year.  Breast cancer gene (BRCA) assessment is recommended for women who have family members with BRCA-related cancers. BRCA-related cancers  include:  Breast.  Ovarian.  Tubal.  Peritoneal cancers.  Results of the assessment will determine the need for genetic counseling and BRCA1 and BRCA2 testing. Cervical Cancer Your health care provider may recommend that you be screened regularly for cancer of the pelvic organs (ovaries, uterus, and vagina). This screening involves a pelvic examination, including checking for microscopic changes to the surface of your cervix (Pap test). You may be encouraged to have this screening done every 3 years, beginning at age 21.  For women ages 30-65, health care providers may recommend pelvic exams and Pap testing every 3 years, or they may recommend the Pap and pelvic exam, combined with testing for human papilloma virus (HPV), every 5 years. Some types of HPV increase your risk of cervical cancer. Testing for HPV may also be done on women of any age with unclear Pap test results.  Other health care providers may not recommend any screening for nonpregnant women who are considered low risk for pelvic cancer and who do not have symptoms. Ask your health care provider if a screening pelvic exam is right for you.  If you have had past treatment for cervical cancer or a condition that could lead to cancer, you need Pap tests and screening for cancer for at least 20 years after your treatment. If Pap tests have been discontinued, your risk factors (such as having a new sexual partner) need to be reassessed to determine if screening should resume. Some women have medical problems that increase the chance of getting cervical cancer. In these cases, your health care provider may recommend more frequent screening and Pap tests. Colorectal Cancer  This type of cancer can be detected and often prevented.  Routine colorectal cancer screening usually begins at 39 years of age and continues through 39 years of age.  Your health care provider may recommend screening at an earlier age if you have risk factors for  colon cancer.  Your health care provider may also recommend using home test kits to check for hidden blood in the stool.  A small camera at the end of a tube can be used to examine your colon directly (sigmoidoscopy or colonoscopy). This is done to check for the earliest forms of colorectal cancer.  Routine screening usually begins at age 50.  Direct examination of the colon should be repeated every 5-10 years through 39 years of age. However, you may need to be screened more often if early forms of precancerous polyps or small growths are found. Skin Cancer  Check your skin from head to toe regularly.  Tell your health care provider about any new moles or changes in moles, especially if there is a change in a mole's shape or color.  Also tell your health care provider if you have a mole that is larger than the size of a pencil eraser.  Always use sunscreen. Apply sunscreen liberally and repeatedly throughout the   day.  Protect yourself by wearing long sleeves, pants, a wide-brimmed hat, and sunglasses whenever you are outside. HEART DISEASE, DIABETES, AND HIGH BLOOD PRESSURE   High blood pressure causes heart disease and increases the risk of stroke. High blood pressure is more likely to develop in:  People who have blood pressure in the high end of the normal range (130-139/85-89 mm Hg).  People who are overweight or obese.  People who are African American.  If you are 18-39 years of age, have your blood pressure checked every 3-5 years. If you are 40 years of age or older, have your blood pressure checked every year. You should have your blood pressure measured twice--once when you are at a hospital or clinic, and once when you are not at a hospital or clinic. Record the average of the two measurements. To check your blood pressure when you are not at a hospital or clinic, you can use:  An automated blood pressure machine at a pharmacy.  A home blood pressure monitor.  If you  are between 55 years and 79 years old, ask your health care provider if you should take aspirin to prevent strokes.  Have regular diabetes screenings. This involves taking a blood sample to check your fasting blood sugar level.  If you are at a normal weight and have a low risk for diabetes, have this test once every three years after 39 years of age.  If you are overweight and have a high risk for diabetes, consider being tested at a younger age or more often. PREVENTING INFECTION  Hepatitis B  If you have a higher risk for hepatitis B, you should be screened for this virus. You are considered at high risk for hepatitis B if:  You were born in a country where hepatitis B is common. Ask your health care provider which countries are considered high risk.  Your parents were born in a high-risk country, and you have not been immunized against hepatitis B (hepatitis B vaccine).  You have HIV or AIDS.  You use needles to inject street drugs.  You live with someone who has hepatitis B.  You have had sex with someone who has hepatitis B.  You get hemodialysis treatment.  You take certain medicines for conditions, including cancer, organ transplantation, and autoimmune conditions. Hepatitis C  Blood testing is recommended for:  Everyone born from 1945 through 1965.  Anyone with known risk factors for hepatitis C. Sexually transmitted infections (STIs)  You should be screened for sexually transmitted infections (STIs) including gonorrhea and chlamydia if:  You are sexually active and are younger than 39 years of age.  You are older than 39 years of age and your health care provider tells you that you are at risk for this type of infection.  Your sexual activity has changed since you were last screened and you are at an increased risk for chlamydia or gonorrhea. Ask your health care provider if you are at risk.  If you do not have HIV, but are at risk, it may be recommended that you  take a prescription medicine daily to prevent HIV infection. This is called pre-exposure prophylaxis (PrEP). You are considered at risk if:  You are sexually active and do not regularly use condoms or know the HIV status of your partner(s).  You take drugs by injection.  You are sexually active with a partner who has HIV. Talk with your health care provider about whether you are at high risk of   being infected with HIV. If you choose to begin PrEP, you should first be tested for HIV. You should then be tested every 3 months for as long as you are taking PrEP.  PREGNANCY   If you are premenopausal and you may become pregnant, ask your health care provider about preconception counseling.  If you may become pregnant, take 400 to 800 micrograms (mcg) of folic acid every day.  If you want to prevent pregnancy, talk to your health care provider about birth control (contraception). OSTEOPOROSIS AND MENOPAUSE   Osteoporosis is a disease in which the bones lose minerals and strength with aging. This can result in serious bone fractures. Your risk for osteoporosis can be identified using a bone density scan.  If you are 65 years of age or older, or if you are at risk for osteoporosis and fractures, ask your health care provider if you should be screened.  Ask your health care provider whether you should take a calcium or vitamin D supplement to lower your risk for osteoporosis.  Menopause may have certain physical symptoms and risks.  Hormone replacement therapy may reduce some of these symptoms and risks. Talk to your health care provider about whether hormone replacement therapy is right for you.  HOME CARE INSTRUCTIONS   Schedule regular health, dental, and eye exams.  Stay current with your immunizations.   Do not use any tobacco products including cigarettes, chewing tobacco, or electronic cigarettes.  If you are pregnant, do not drink alcohol.  If you are breastfeeding, limit how  much and how often you drink alcohol.  Limit alcohol intake to no more than 1 drink per day for nonpregnant women. One drink equals 12 ounces of beer, 5 ounces of wine, or 1 ounces of hard liquor.  Do not use street drugs.  Do not share needles.  Ask your health care provider for help if you need support or information about quitting drugs.  Tell your health care provider if you often feel depressed.  Tell your health care provider if you have ever been abused or do not feel safe at home.   This information is not intended to replace advice given to you by your health care provider. Make sure you discuss any questions you have with your health care provider.   Document Released: 03/26/2011 Document Revised: 10/01/2014 Document Reviewed: 08/12/2013 Elsevier Interactive Patient Education 2016 [employer]  

## 2016-05-04 NOTE — Assessment & Plan Note (Signed)
Checking TSH and free T4 and adjust 100 mcg synthroid as needed.

## 2016-05-04 NOTE — Assessment & Plan Note (Signed)
Rx for buspar which she has used in the past with good success.

## 2016-05-04 NOTE — Assessment & Plan Note (Signed)
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 Immunizations up to date and reminded about flu shot. Counseled on sun safety and the dangers of distracted driving. Given screening recommendations. EKG normal.

## 2016-05-04 NOTE — Progress Notes (Signed)
   Subjective:    Patient ID: Chelsea Lindsey, female    DOB: 10-12-1975, 40 y.o.   MRN: [id number]  HPI The patient is a 40 YO female coming in for wellness. No new concerns.   PMH, [facility], social history reviewed and updated.   Review of Systems  Constitutional: Negative for activity change, appetite change, fatigue and fever.  HENT: Negative.   Eyes: Negative.   Respiratory: Negative for cough, chest tightness, shortness of breath and wheezing.   Cardiovascular: Negative for chest pain, palpitations and leg swelling.  Gastrointestinal: Negative for abdominal distention, abdominal pain, constipation, diarrhea and nausea.  Musculoskeletal: Negative.   Skin: Negative.   Neurological: Negative.   Psychiatric/Behavioral: Negative.       Objective:   Physical Exam  Constitutional: She is oriented to person, place, and time. She appears well-developed and well-nourished.  HENT:  Head: Normocephalic and atraumatic.  Eyes: EOM are normal.  Neck: Normal range of motion.  Cardiovascular: Normal rate and regular rhythm.   Pulmonary/Chest: Effort normal. No respiratory distress. She has no wheezes. She has no rales.  Abdominal: Soft. Bowel sounds are normal. She exhibits no distension. There is no tenderness. There is no rebound.  Musculoskeletal: She exhibits no edema.  Neurological: She is alert and oriented to person, place, and time. Coordination normal.  Skin: Skin is warm and dry.  Psychiatric: She has a normal mood and affect.   Vitals:   05/04/16 0905  BP: 98/64  Pulse: 73  Resp: 12  Temp: 98.4 F (36.9 C)  TempSrc: Oral  SpO2: 99%  Weight: 175 lb (79.4 kg)  Height: 6' (1.829 m)   EKG: rate 68, axis normal, intervals normal, no st or t wave changes    Assessment & Plan:

## 2016-05-04 NOTE — Progress Notes (Signed)
Pre visit review using our clinic review tool, if applicable. No additional management support is needed unless otherwise documented below in the visit note. 

## 2016-05-14 ENCOUNTER — Ambulatory Visit: Payer: 59 | Admitting: Obstetrics & Gynecology

## 2016-05-30 ENCOUNTER — Ambulatory Visit (INDEPENDENT_AMBULATORY_CARE_PROVIDER_SITE_OTHER): Payer: 59 | Admitting: Family Medicine

## 2016-05-30 ENCOUNTER — Encounter: Payer: Self-pay | Admitting: Family Medicine

## 2016-05-30 DIAGNOSIS — Z124 Encounter for screening for malignant neoplasm of cervix: Secondary | ICD-10-CM | POA: Diagnosis not present

## 2016-05-30 DIAGNOSIS — Z1151 Encounter for screening for human papillomavirus (HPV): Secondary | ICD-10-CM | POA: Diagnosis not present

## 2016-05-30 DIAGNOSIS — Z01419 Encounter for gynecological examination (general) (routine) without abnormal findings: Secondary | ICD-10-CM

## 2016-05-30 NOTE — Patient Instructions (Signed)
Preventive Care for Adults, Female A healthy lifestyle and preventive care can promote health and wellness. Preventive health guidelines for women include the following key practices.  A routine yearly physical is a good way to check with your health care provider about your health and preventive screening. It is a chance to share any concerns and updates on your health and to receive a thorough exam.  Visit your dentist for a routine exam and preventive care every 6 months. Brush your teeth twice a day and floss once a day. Good oral hygiene prevents tooth decay and gum disease.  The frequency of eye exams is based on your age, health, family medical history, use of contact lenses, and other factors. Follow your health care provider's recommendations for frequency of eye exams.  Eat a healthy diet. Foods like vegetables, fruits, whole grains, low-fat dairy products, and lean protein foods contain the nutrients you need without too many calories. Decrease your intake of foods high in solid fats, added sugars, and salt. Eat the right amount of calories for you.Get information about a proper diet from your health care provider, if necessary.  Regular physical exercise is one of the most important things you can do for your health. Most adults should get at least 150 minutes of moderate-intensity exercise (any activity that increases your heart rate and causes you to sweat) each week. In addition, most adults need muscle-strengthening exercises on 2 or more days a week.  Maintain a healthy weight. The body mass index (BMI) is a screening tool to identify possible weight problems. It provides an estimate of body fat based on height and weight. Your health care provider can find your BMI and can help you achieve or maintain a healthy weight.For adults 20 years and older:  A BMI below 18.5 is considered underweight.  A BMI of 18.5 to 24.9 is normal.  A BMI of 25 to 29.9 is considered overweight.  A  BMI of 30 and above is considered obese.  Maintain normal blood lipids and cholesterol levels by exercising and minimizing your intake of saturated fat. Eat a balanced diet with plenty of fruit and vegetables. Blood tests for lipids and cholesterol should begin at age 45 and be repeated every 5 years. If your lipid or cholesterol levels are high, you are over 50, or you are at high risk for heart disease, you may need your cholesterol levels checked more frequently.Ongoing high lipid and cholesterol levels should be treated with medicines if diet and exercise are not working.  If you smoke, find out from your health care provider how to quit. If you do not use tobacco, do not start.  Lung cancer screening is recommended for adults aged 45-80 years who are at high risk for developing lung cancer because of a history of smoking. A yearly low-dose CT scan of the lungs is recommended for people who have at least a 30-pack-year history of smoking and are a current smoker or have quit within the past 15 years. A pack year of smoking is smoking an average of 1 pack of cigarettes a day for 1 year (for example: 1 pack a day for 30 years or 2 packs a day for 15 years). Yearly screening should continue until the smoker has stopped smoking for at least 15 years. Yearly screening should be stopped for people who develop a health problem that would prevent them from having lung cancer treatment.  If you are pregnant, do not drink alcohol. If you are  breastfeeding, be very cautious about drinking alcohol. If you are not pregnant and choose to drink alcohol, do not have more than 1 drink per day. One drink is considered to be 12 ounces (355 mL) of beer, 5 ounces (148 mL) of wine, or 1.5 ounces (44 mL) of liquor.  Avoid use of street drugs. Do not share needles with anyone. Ask for help if you need support or instructions about stopping the use of drugs.  High blood pressure causes heart disease and increases the risk  of stroke. Your blood pressure should be checked at least every 1 to 2 years. Ongoing high blood pressure should be treated with medicines if weight loss and exercise do not work.  If you are 55-79 years old, ask your health care provider if you should take aspirin to prevent strokes.  Diabetes screening is done by taking a blood sample to check your blood glucose level after you have not eaten for a certain period of time (fasting). If you are not overweight and you do not have risk factors for diabetes, you should be screened once every 3 years starting at age 45. If you are overweight or obese and you are 40-70 years of age, you should be screened for diabetes every year as part of your cardiovascular risk assessment.  Breast cancer screening is essential preventive care for women. You should practice "breast self-awareness." This means understanding the normal appearance and feel of your breasts and may include breast self-examination. Any changes detected, no matter how small, should be reported to a health care provider. Women in their 20s and 30s should have a clinical breast exam (CBE) by a health care provider as part of a regular health exam every 1 to 3 years. After age 40, women should have a CBE every year. Starting at age 40, women should consider having a mammogram (breast X-ray test) every year. Women who have a family history of breast cancer should talk to their health care provider about genetic screening. Women at a high risk of breast cancer should talk to their health care providers about having an MRI and a mammogram every year.  Breast cancer gene (BRCA)-related cancer risk assessment is recommended for women who have family members with BRCA-related cancers. BRCA-related cancers include breast, ovarian, tubal, and peritoneal cancers. Having family members with these cancers may be associated with an increased risk for harmful changes (mutations) in the breast cancer genes BRCA1 and  BRCA2. Results of the assessment will determine the need for genetic counseling and BRCA1 and BRCA2 testing.  Your health care provider may recommend that you be screened regularly for cancer of the pelvic organs (ovaries, uterus, and vagina). This screening involves a pelvic examination, including checking for microscopic changes to the surface of your cervix (Pap test). You may be encouraged to have this screening done every 3 years, beginning at age 21.  For women ages 30-65, health care providers may recommend pelvic exams and Pap testing every 3 years, or they may recommend the Pap and pelvic exam, combined with testing for human papilloma virus (HPV), every 5 years. Some types of HPV increase your risk of cervical cancer. Testing for HPV may also be done on women of any age with unclear Pap test results.  Other health care providers may not recommend any screening for nonpregnant women who are considered low risk for pelvic cancer and who do not have symptoms. Ask your health care provider if a screening pelvic exam is right for   you.  If you have had past treatment for cervical cancer or a condition that could lead to cancer, you need Pap tests and screening for cancer for at least 20 years after your treatment. If Pap tests have been discontinued, your risk factors (such as having a new sexual partner) need to be reassessed to determine if screening should resume. Some women have medical problems that increase the chance of getting cervical cancer. In these cases, your health care provider may recommend more frequent screening and Pap tests.  Colorectal cancer can be detected and often prevented. Most routine colorectal cancer screening begins at the age of 50 years and continues through age 75 years. However, your health care provider may recommend screening at an earlier age if you have risk factors for colon cancer. On a yearly basis, your health care provider may provide home test kits to check  for hidden blood in the stool. Use of a small camera at the end of a tube, to directly examine the colon (sigmoidoscopy or colonoscopy), can detect the earliest forms of colorectal cancer. Talk to your health care provider about this at age 50, when routine screening begins. Direct exam of the colon should be repeated every 5-10 years through age 75 years, unless early forms of precancerous polyps or small growths are found.  People who are at an increased risk for hepatitis B should be screened for this virus. You are considered at high risk for hepatitis B if:  You were born in a country where hepatitis B occurs often. Talk with your health care provider about which countries are considered high risk.  Your parents were born in a high-risk country and you have not received a shot to protect against hepatitis B (hepatitis B vaccine).  You have HIV or AIDS.  You use needles to inject street drugs.  You live with, or have sex with, someone who has hepatitis B.  You get hemodialysis treatment.  You take certain medicines for conditions like cancer, organ transplantation, and autoimmune conditions.  Hepatitis C blood testing is recommended for all people born from 1945 through 1965 and any individual with known risks for hepatitis C.  Practice safe sex. Use condoms and avoid high-risk sexual practices to reduce the spread of sexually transmitted infections (STIs). STIs include gonorrhea, chlamydia, syphilis, trichomonas, herpes, HPV, and human immunodeficiency virus (HIV). Herpes, HIV, and HPV are viral illnesses that have no cure. They can result in disability, cancer, and death.  You should be screened for sexually transmitted illnesses (STIs) including gonorrhea and chlamydia if:  You are sexually active and are younger than 24 years.  You are older than 24 years and your health care provider tells you that you are at risk for this type of infection.  Your sexual activity has changed  since you were last screened and you are at an increased risk for chlamydia or gonorrhea. Ask your health care provider if you are at risk.  If you are at risk of being infected with HIV, it is recommended that you take a prescription medicine daily to prevent HIV infection. This is called preexposure prophylaxis (PrEP). You are considered at risk if:  You are sexually active and do not regularly use condoms or know the HIV status of your partner(s).  You take drugs by injection.  You are sexually active with a partner who has HIV.  Talk with your health care provider about whether you are at high risk of being infected with HIV. If   you choose to begin PrEP, you should first be tested for HIV. You should then be tested every 3 months for as long as you are taking PrEP.  Osteoporosis is a disease in which the bones lose minerals and strength with aging. This can result in serious bone fractures or breaks. The risk of osteoporosis can be identified using a bone density scan. Women ages 67 years and over and women at risk for fractures or osteoporosis should discuss screening with their health care providers. Ask your health care provider whether you should take a calcium supplement or vitamin D to reduce the rate of osteoporosis.  Menopause can be associated with physical symptoms and risks. Hormone replacement therapy is available to decrease symptoms and risks. You should talk to your health care provider about whether hormone replacement therapy is right for you.  Use sunscreen. Apply sunscreen liberally and repeatedly throughout the day. You should seek shade when your shadow is shorter than you. Protect yourself by wearing long sleeves, pants, a wide-brimmed hat, and sunglasses year round, whenever you are outdoors.  Once a month, do a whole body skin exam, using a mirror to look at the skin on your back. Tell your health care provider of new moles, moles that have irregular borders, moles that  are larger than a pencil eraser, or moles that have changed in shape or color.  Stay current with required vaccines (immunizations).  Influenza vaccine. All adults should be immunized every year.  Tetanus, diphtheria, and acellular pertussis (Td, Tdap) vaccine. Pregnant women should receive 1 dose of Tdap vaccine during each pregnancy. The dose should be obtained regardless of the length of time since the last dose. Immunization is preferred during the 27th-36th week of gestation. An adult who has not previously received Tdap or who does not know her vaccine status should receive 1 dose of Tdap. This initial dose should be followed by tetanus and diphtheria toxoids (Td) booster doses every 10 years. Adults with an unknown or incomplete history of completing a 3-dose immunization series with Td-containing vaccines should begin or complete a primary immunization series including a Tdap dose. Adults should receive a Td booster every 10 years.  Varicella vaccine. An adult without evidence of immunity to varicella should receive 2 doses or a second dose if she has previously received 1 dose. Pregnant females who do not have evidence of immunity should receive the first dose after pregnancy. This first dose should be obtained before leaving the health care facility. The second dose should be obtained 4-8 weeks after the first dose.  Human papillomavirus (HPV) vaccine. Females aged 13-26 years who have not received the vaccine previously should obtain the 3-dose series. The vaccine is not recommended for use in pregnant females. However, pregnancy testing is not needed before receiving a dose. If a female is found to be pregnant after receiving a dose, no treatment is needed. In that case, the remaining doses should be delayed until after the pregnancy. Immunization is recommended for any person with an immunocompromised condition through the age of 61 years if she did not get any or all doses earlier. During the  3-dose series, the second dose should be obtained 4-8 weeks after the first dose. The third dose should be obtained 24 weeks after the first dose and 16 weeks after the second dose.  Zoster vaccine. One dose is recommended for adults aged 30 years or older unless certain conditions are present.  Measles, mumps, and rubella (MMR) vaccine. Adults born  before 1957 generally are considered immune to measles and mumps. Adults born in 1957 or later should have 1 or more doses of MMR vaccine unless there is a contraindication to the vaccine or there is laboratory evidence of immunity to each of the three diseases. A routine second dose of MMR vaccine should be obtained at least 28 days after the first dose for students attending postsecondary schools, health care workers, or international travelers. People who received inactivated measles vaccine or an unknown type of measles vaccine during 1963-1967 should receive 2 doses of MMR vaccine. People who received inactivated mumps vaccine or an unknown type of mumps vaccine before 1979 and are at high risk for mumps infection should consider immunization with 2 doses of MMR vaccine. For females of childbearing age, rubella immunity should be determined. If there is no evidence of immunity, females who are not pregnant should be vaccinated. If there is no evidence of immunity, females who are pregnant should delay immunization until after pregnancy. Unvaccinated health care workers born before 1957 who lack laboratory evidence of measles, mumps, or rubella immunity or laboratory confirmation of disease should consider measles and mumps immunization with 2 doses of MMR vaccine or rubella immunization with 1 dose of MMR vaccine.  Pneumococcal 13-valent conjugate (PCV13) vaccine. When indicated, a person who is uncertain of his immunization history and has no record of immunization should receive the PCV13 vaccine. All adults 65 years of age and older should receive this  vaccine. An adult aged 19 years or older who has certain medical conditions and has not been previously immunized should receive 1 dose of PCV13 vaccine. This PCV13 should be followed with a dose of pneumococcal polysaccharide (PPSV23) vaccine. Adults who are at high risk for pneumococcal disease should obtain the PPSV23 vaccine at least 8 weeks after the dose of PCV13 vaccine. Adults older than 39 years of age who have normal immune system function should obtain the PPSV23 vaccine dose at least 1 year after the dose of PCV13 vaccine.  Pneumococcal polysaccharide (PPSV23) vaccine. When PCV13 is also indicated, PCV13 should be obtained first. All adults aged 65 years and older should be immunized. An adult younger than age 65 years who has certain medical conditions should be immunized. Any person who resides in a nursing home or long-term care facility should be immunized. An adult smoker should be immunized. People with an immunocompromised condition and certain other conditions should receive both PCV13 and PPSV23 vaccines. People with human immunodeficiency virus (HIV) infection should be immunized as soon as possible after diagnosis. Immunization during chemotherapy or radiation therapy should be avoided. Routine use of PPSV23 vaccine is not recommended for American Indians, [place] Natives, or people younger than 65 years unless there are medical conditions that require PPSV23 vaccine. When indicated, people who have unknown immunization and have no record of immunization should receive PPSV23 vaccine. One-time revaccination 5 years after the first dose of PPSV23 is recommended for people aged 19-64 years who have chronic kidney failure, nephrotic syndrome, asplenia, or immunocompromised conditions. People who received 1-2 doses of PPSV23 before age 65 years should receive another dose of PPSV23 vaccine at age 65 years or later if at least 5 years have passed since the previous dose. Doses of PPSV23 are not  needed for people immunized with PPSV23 at or after age 65 years.  Meningococcal vaccine. Adults with asplenia or persistent complement component deficiencies should receive 2 doses of quadrivalent meningococcal conjugate (MenACWY-D) vaccine. The doses should be obtained   at least 2 months apart. Microbiologists working with certain meningococcal bacteria, [place] recruits, people at risk during an outbreak, and people who travel to or live in countries with a high rate of meningitis should be immunized. A first-year college student up through age 34 years who is living in a residence hall should receive a dose if she did not receive a dose on or after her 16th birthday. Adults who have certain high-risk conditions should receive one or more doses of vaccine.  Hepatitis A vaccine. Adults who wish to be protected from this disease, have certain high-risk conditions, work with hepatitis A-infected animals, work in hepatitis A research labs, or travel to or work in countries with a high rate of hepatitis A should be immunized. Adults who were previously unvaccinated and who anticipate close contact with an international adoptee during the first 60 days after arrival in the [place] from a country with a high rate of hepatitis A should be immunized.  Hepatitis B vaccine. Adults who wish to be protected from this disease, have certain high-risk conditions, may be exposed to blood or other infectious body fluids, are household contacts or sex partners of hepatitis B positive people, are clients or workers in certain care facilities, or travel to or work in countries with a high rate of hepatitis B should be immunized.  Haemophilus influenzae type b (Hib) vaccine. A previously unvaccinated person with asplenia or sickle cell disease or having a scheduled splenectomy should receive 1 dose of Hib vaccine. Regardless of previous immunization, a recipient of a hematopoietic stem cell transplant should receive a  3-dose series 6-12 months after her successful transplant. Hib vaccine is not recommended for adults with HIV infection. Preventive Services / Frequency Ages 35 to 4 years  Blood pressure check.** / Every 3-5 years.  Lipid and cholesterol check.** / Every 5 years beginning at age 60.  Clinical breast exam.** / Every 3 years for women in their 71s and 10s.  BRCA-related cancer risk assessment.** / For women who have family members with a BRCA-related cancer (breast, ovarian, tubal, or peritoneal cancers).  Pap test.** / Every 2 years from ages 76 through 26. Every 3 years starting at age 61 through age 76 or 93 with a history of 3 consecutive normal Pap tests.  HPV screening.** / Every 3 years from ages 37 through ages 60 to 51 with a history of 3 consecutive normal Pap tests.  Hepatitis C blood test.** / For any individual with known risks for hepatitis C.  Skin self-exam. / Monthly.  Influenza vaccine. / Every year.  Tetanus, diphtheria, and acellular pertussis (Tdap, Td) vaccine.** / Consult your health care provider. Pregnant women should receive 1 dose of Tdap vaccine during each pregnancy. 1 dose of Td every 10 years.  Varicella vaccine.** / Consult your health care provider. Pregnant females who do not have evidence of immunity should receive the first dose after pregnancy.  HPV vaccine. / 3 doses over 6 months, if 93 and younger. The vaccine is not recommended for use in pregnant females. However, pregnancy testing is not needed before receiving a dose.  Measles, mumps, rubella (MMR) vaccine.** / You need at least 1 dose of MMR if you were born in 1957 or later. You may also need a 2nd dose. For females of childbearing age, rubella immunity should be determined. If there is no evidence of immunity, females who are not pregnant should be vaccinated. If there is no evidence of immunity, females who are  pregnant should delay immunization until after pregnancy.  Pneumococcal  13-valent conjugate (PCV13) vaccine.** / Consult your health care provider.  Pneumococcal polysaccharide (PPSV23) vaccine.** / 1 to 2 doses if you smoke cigarettes or if you have certain conditions.  Meningococcal vaccine.** / 1 dose if you are age 68 to 8 years and a Market researcher living in a residence hall, or have one of several medical conditions, you need to get vaccinated against meningococcal disease. You may also need additional booster doses.  Hepatitis A vaccine.** / Consult your health care provider.  Hepatitis B vaccine.** / Consult your health care provider.  Haemophilus influenzae type b (Hib) vaccine.** / Consult your health care provider. Ages 7 to 53 years  Blood pressure check.** / Every year.  Lipid and cholesterol check.** / Every 5 years beginning at age 25 years.  Lung cancer screening. / Every year if you are aged 11-80 years and have a 30-pack-year history of smoking and currently smoke or have quit within the past 15 years. Yearly screening is stopped once you have quit smoking for at least 15 years or develop a health problem that would prevent you from having lung cancer treatment.  Clinical breast exam.** / Every year after age 48 years.  BRCA-related cancer risk assessment.** / For women who have family members with a BRCA-related cancer (breast, ovarian, tubal, or peritoneal cancers).  Mammogram.** / Every year beginning at age 41 years and continuing for as long as you are in good health. Consult with your health care provider.  Pap test.** / Every 3 years starting at age 65 years through age 37 or 70 years with a history of 3 consecutive normal Pap tests.  HPV screening.** / Every 3 years from ages 72 years through ages 60 to 40 years with a history of 3 consecutive normal Pap tests.  Fecal occult blood test (FOBT) of stool. / Every year beginning at age 21 years and continuing until age 5 years. You may not need to do this test if you get  a colonoscopy every 10 years.  Flexible sigmoidoscopy or colonoscopy.** / Every 5 years for a flexible sigmoidoscopy or every 10 years for a colonoscopy beginning at age 35 years and continuing until age 48 years.  Hepatitis C blood test.** / For all people born from [birth year] through 1965 and any individual with known risks for hepatitis C.  Skin self-exam. / Monthly.  Influenza vaccine. / Every year.  Tetanus, diphtheria, and acellular pertussis (Tdap/Td) vaccine.** / Consult your health care provider. Pregnant women should receive 1 dose of Tdap vaccine during each pregnancy. 1 dose of Td every 10 years.  Varicella vaccine.** / Consult your health care provider. Pregnant females who do not have evidence of immunity should receive the first dose after pregnancy.  Zoster vaccine.** / 1 dose for adults aged 30 years or older.  Measles, mumps, rubella (MMR) vaccine.** / You need at least 1 dose of MMR if you were born in 1957 or later. You may also need a second dose. For females of childbearing age, rubella immunity should be determined. If there is no evidence of immunity, females who are not pregnant should be vaccinated. If there is no evidence of immunity, females who are pregnant should delay immunization until after pregnancy.  Pneumococcal 13-valent conjugate (PCV13) vaccine.** / Consult your health care provider.  Pneumococcal polysaccharide (PPSV23) vaccine.** / 1 to 2 doses if you smoke cigarettes or if you have certain conditions.  Meningococcal vaccine.** /  Consult your health care provider.  Hepatitis A vaccine.** / Consult your health care provider.  Hepatitis B vaccine.** / Consult your health care provider.  Haemophilus influenzae type b (Hib) vaccine.** / Consult your health care provider. Ages 64 years and over  Blood pressure check.** / Every year.  Lipid and cholesterol check.** / Every 5 years beginning at age 23 years.  Lung cancer screening. / Every year if you  are aged 16-80 years and have a 30-pack-year history of smoking and currently smoke or have quit within the past 15 years. Yearly screening is stopped once you have quit smoking for at least 15 years or develop a health problem that would prevent you from having lung cancer treatment.  Clinical breast exam.** / Every year after age 74 years.  BRCA-related cancer risk assessment.** / For women who have family members with a BRCA-related cancer (breast, ovarian, tubal, or peritoneal cancers).  Mammogram.** / Every year beginning at age 44 years and continuing for as long as you are in good health. Consult with your health care provider.  Pap test.** / Every 3 years starting at age 58 years through age 22 or 39 years with 3 consecutive normal Pap tests. Testing can be stopped between 65 and 70 years with 3 consecutive normal Pap tests and no abnormal Pap or HPV tests in the past 10 years.  HPV screening.** / Every 3 years from ages 64 years through ages 70 or 61 years with a history of 3 consecutive normal Pap tests. Testing can be stopped between 65 and 70 years with 3 consecutive normal Pap tests and no abnormal Pap or HPV tests in the past 10 years.  Fecal occult blood test (FOBT) of stool. / Every year beginning at age 40 years and continuing until age 27 years. You may not need to do this test if you get a colonoscopy every 10 years.  Flexible sigmoidoscopy or colonoscopy.** / Every 5 years for a flexible sigmoidoscopy or every 10 years for a colonoscopy beginning at age 7 years and continuing until age 32 years.  Hepatitis C blood test.** / For all people born from [birth year] through 1965 and any individual with known risks for hepatitis C.  Osteoporosis screening.** / A one-time screening for women ages 30 years and over and women at risk for fractures or osteoporosis.  Skin self-exam. / Monthly.  Influenza vaccine. / Every year.  Tetanus, diphtheria, and acellular pertussis (Tdap/Td)  vaccine.** / 1 dose of Td every 10 years.  Varicella vaccine.** / Consult your health care provider.  Zoster vaccine.** / 1 dose for adults aged 35 years or older.  Pneumococcal 13-valent conjugate (PCV13) vaccine.** / Consult your health care provider.  Pneumococcal polysaccharide (PPSV23) vaccine.** / 1 dose for all adults aged 46 years and older.  Meningococcal vaccine.** / Consult your health care provider.  Hepatitis A vaccine.** / Consult your health care provider.  Hepatitis B vaccine.** / Consult your health care provider.  Haemophilus influenzae type b (Hib) vaccine.** / Consult your health care provider. ** Family history and personal history of risk and conditions may change your health care provider's recommendations.   This information is not intended to replace advice given to you by your health care provider. Make sure you discuss any questions you have with your health care provider.   Document Released: 11/06/2001 Document Revised: 10/01/2014 Document Reviewed: 02/05/2011 Elsevier Interactive Patient Education [employer].

## 2016-05-30 NOTE — Progress Notes (Signed)
   Subjective:     Chelsea Lindsey is a 40 y.o. female and is here for a comprehensive physical exam. The patient reports problems - leaks clear fluid from one breast. Stopped breastfeeding 6+ months ago. Regular cycles, not too heavy. Gets yearly exam with PCP who does blood work. Flu vaccine through work.  Social History   Social History  . Marital status: Married    Spouse name: N/A  . Number of children: N/A  . Years of education: N/A   Occupational History  . OT    Social History Main Topics  . Smoking status: Never Smoker  . Smokeless tobacco: Never Used  . Alcohol use No  . Drug use: No  . Sexual activity: Yes    Partners: Male   Other Topics Concern  . Not on file   Social History Narrative  . No narrative on file   Health Maintenance  Topic Date Due  . INFLUENZA VACCINE  04/24/2016  . PAP SMEAR  04/19/2018  . TETANUS/TDAP  05/08/2024  . HIV Screening  Completed    The following portions of the patient's history were reviewed and updated as appropriate: allergies, current medications, past family history, past medical history, past social history, past surgical history and problem list.  Review of Systems Pertinent items noted in HPI and remainder of comprehensive ROS otherwise negative.   Objective:    BP 113/74   Pulse 74   Ht 6' (1.829 m)   Wt 178 lb (80.7 kg)   LMP 05/14/2016 (Exact Date)   BMI 24.14 kg/m  General appearance: alert, cooperative and appears stated age Head: Normocephalic, without obvious abnormality, atraumatic Neck: no adenopathy, supple, symmetrical, trachea midline and thyroid not enlarged, symmetric, no tenderness/mass/nodules Lungs: clear to auscultation bilaterally Breasts: normal appearance, no masses or tenderness Heart: regular rate and rhythm, S1, S2 normal, no murmur, click, rub or gallop Abdomen: soft, non-tender; bowel sounds normal; no masses,  no organomegaly Pelvic: cervix normal in appearance, external genitalia normal,  no adnexal masses or tenderness, no cervical motion tenderness, uterus normal size, shape, and consistency and vagina normal without discharge Extremities: extremities normal, atraumatic, no cyanosis or edema Pulses: 2+ and symmetric Skin: Skin color, texture, turgor normal. No rashes or lesions Lymph nodes: Cervical, supraclavicular, and axillary nodes normal. Neurologic: Grossly normal    Assessment:    Healthy female exam.      Plan:      Problem List Items Addressed This Visit    None    Visit Diagnoses    Screening for malignant neoplasm of cervix       Encounter for routine gynecological examination       Relevant Orders   Cytology - PAP     Screening mammogram at age 65. 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 See After Visit Summary for Counseling Recommendations

## 2016-06-01 LAB — CYTOLOGY - PAP

## 2016-06-15 MED FILL — busPIRone HCL 10 MG TABS: 10 | 30 days supply | Qty: 60 | Fill #1

## 2016-07-03 ENCOUNTER — Ambulatory Visit (INDEPENDENT_AMBULATORY_CARE_PROVIDER_SITE_OTHER): Payer: 59 | Admitting: Internal Medicine

## 2016-07-03 ENCOUNTER — Encounter: Payer: Self-pay | Admitting: Internal Medicine

## 2016-07-03 DIAGNOSIS — B379 Candidiasis, unspecified: Secondary | ICD-10-CM | POA: Diagnosis not present

## 2016-07-03 MED ORDER — FLUCONAZOLE 150 MG PO TABS: 150.0000 mg | ORAL_TABLET | ORAL | 0 refills | Status: DC

## 2016-07-03 MED FILL — FLUCONAZOLE 150 MG TABLET: 150 | 9 days supply | Qty: 3 | Fill #0

## 2016-07-03 NOTE — Assessment & Plan Note (Signed)
Rx for diflucan times 3 q 72 hours.

## 2016-07-03 NOTE — Progress Notes (Signed)
   Subjective:    Patient ID: Chelsea Lindsey, female    DOB: 04/26/1976, 40 y.o.   MRN: [id number]  HPI The patient is a 40 YO female coming in for burning and itching vaginally for the last 3-4 days. She tried monistat 3 over the counter which was partially effective but did not do well. The symptoms recurred when she stopped taking it. No fevers or chills. No other vaginal discharge. No new partners. No concerns for STD or pregnancy.   Review of Systems  Constitutional: Negative for activity change, appetite change, fatigue, fever and unexpected weight change.  Respiratory: Negative.   Cardiovascular: Negative.   Gastrointestinal: Negative.   Genitourinary: Positive for vaginal discharge. Negative for difficulty urinating, dyspareunia, dysuria, frequency, genital sores, hematuria, pelvic pain, vaginal bleeding and vaginal pain.  Musculoskeletal: Negative.   Neurological: Negative.       Objective:   Physical Exam  Constitutional: She is oriented to person, place, and time. She appears well-developed and well-nourished.  HENT:  Head: Normocephalic and atraumatic.  Eyes: EOM are normal.  Neck: Normal range of motion.  Cardiovascular: Normal rate and regular rhythm.   Pulmonary/Chest: Effort normal and breath sounds normal. No respiratory distress. She has no wheezes. She has no rales.  Abdominal: Soft. She exhibits no distension. There is no tenderness.  Genitourinary:  Genitourinary Comments: Declines genital exam  Musculoskeletal: She exhibits no edema.  Neurological: She is alert and oriented to person, place, and time.  Skin: Skin is warm and dry.   Vitals:   07/03/16 0834  BP: 102/68  Pulse: 64  Resp: 14  Temp: 98.4 F (36.9 C)  TempSrc: Oral  SpO2: 98%  Weight: 177 lb (80.3 kg)  Height: 6' (1.829 m)      Assessment & Plan:

## 2016-07-03 NOTE — Patient Instructions (Signed)
We have sent in diflucan for the yeast infection. Take 1 pill today, then take 1 pill Friday, then take 1 pill Monday.

## 2016-07-03 NOTE — Progress Notes (Signed)
Pre visit review using our clinic review tool, if applicable. No additional management support is needed unless otherwise documented below in the visit note. 

## 2016-07-11 MED FILL — SYNTHROID 100 MCG TABLET: 100 | 90 days supply | Qty: 90 | Fill #1

## 2016-07-20 ENCOUNTER — Encounter: Payer: Self-pay | Admitting: Family

## 2016-07-20 ENCOUNTER — Ambulatory Visit: Payer: 59 | Admitting: Nurse Practitioner

## 2016-07-20 ENCOUNTER — Ambulatory Visit (INDEPENDENT_AMBULATORY_CARE_PROVIDER_SITE_OTHER): Payer: 59 | Admitting: Family

## 2016-07-20 DIAGNOSIS — M7918 Myalgia, other site: Secondary | ICD-10-CM | POA: Insufficient documentation

## 2016-07-20 DIAGNOSIS — M791 Myalgia: Secondary | ICD-10-CM

## 2016-07-20 MED ORDER — METHOCARBAMOL 500 MG PO TABS: 500.0000 mg | ORAL_TABLET | Freq: Four times a day (QID) | ORAL | 0 refills | Status: DC

## 2016-07-20 MED ORDER — MELOXICAM 15 MG PO TABS
7.5000 mg | ORAL_TABLET | Freq: Every day | ORAL | 0 refills | Status: DC | PRN
Start: 2016-07-20 — End: 2016-11-01

## 2016-07-20 MED FILL — MELOXICAM 15 MG TABLET: 15 | 30 days supply | Qty: 30 | Fill #0

## 2016-07-20 MED FILL — METHOCARBAMOL 500 MG TABLET: 500 | 23 days supply | Qty: 90 | Fill #0

## 2016-07-20 NOTE — Progress Notes (Signed)
Subjective:    Patient ID: Chelsea Lindsey, female    DOB: 07-May-1976, 39 y.o.   MRN: [id number]  Chief Complaint  Patient presents with  . Back Pain    right lower back and down her right cheek, thinks she strained it at work trying to lift a pt, states she is having spasms    HPI:  Chelsea Lindsey is a 40 y.o. female who  has a past medical history of Cervical dysplasia; GERD (gastroesophageal reflux disease); HPV in female; Hypothyroid; and Post partum depression. and presents today for an office visit.   This is a new problem. Associated symptom of pain located on the right side of her back has been going on for about 2 weeks following attempting to life a patient while at work. Pain is described as dull and achy and occasional tightness and spasm. Modifying factors include Mobic and Robaxin which did seem to help but she has run out of them and the pain has worsened slightly. No restrictions in motion. Occasional numbness and tingling down the front of her right leg. No saddle anesthesia or changes to bowel habits. Course of the symptoms has generally worsened since stopping the muscle relaxer. 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   No Known Allergies    Outpatient Medications Prior to Visit  Medication Sig Dispense Refill  . busPIRone (BUSPAR) 10 MG tablet Take 1 tablet (10 mg total) by mouth 2 (two) times daily. 60 tablet 11  . fluconazole (DIFLUCAN) 150 MG tablet Take 1 tablet (150 mg total) by mouth every 3 (three) days. 3 tablet 0  . SYNTHROID 100 MCG tablet TAKE 1 TABLET BY MOUTH ONCE DAILY BEFORE BREAKFAST 90 tablet 3   No facility-administered medications prior to visit.       Past Surgical History:  Procedure Laterality Date  . WISDOM TOOTH EXTRACTION        Past Medical History:  Diagnosis Date  . Cervical dysplasia    colposcopy  . GERD (gastroesophageal reflux disease)   . HPV in female   . Hypothyroid   . Post partum depression         Review of Systems  Constitutional: Negative for chills and fever.  Musculoskeletal: Positive for back pain.  Neurological: Positive for numbness. Negative for weakness.      Objective:    BP 122/88 (BP Location: Left Arm, Patient Position: Sitting, Cuff Size: Normal)   Pulse 76   Temp 98.5 F (36.9 C) (Oral)   Resp 16   Ht 6' (1.829 m)   Wt 175 lb (79.4 kg)   SpO2 98%   BMI 23.73 kg/m  Nursing note and vital signs reviewed.  Physical Exam  Constitutional: She is oriented to person, place, and time. She appears well-developed and well-nourished. No distress.  Cardiovascular: Normal rate, regular rhythm, normal heart sounds and intact distal pulses.   Pulmonary/Chest: Effort normal and breath sounds normal.  Musculoskeletal:  Low back - No obvious deformity, discoloration or deformity. There is tenderness of piriformis and gluteus medius. Range of motion is restricted in forward flexion and hip flexion. Distal pulses and sensation are intact and appropriate. Reflexes are intact and appropriate. Negative straight leg raise; negative Faber's.  Neurological: She is alert and oriented to person, place, and time.  Skin: Skin is warm and dry.  Psychiatric: She has a normal mood and affect. Her behavior is normal. Judgment and thought content normal.  Assessment & Plan:   Problem List Items Addressed This Visit      Other   Piriformis muscle pain    Symptoms and exam consistent with piriformis syndrome/muscle spasms starting from working with a patient. Treat conservatively with meloxicam and Robaxin. Recommend continued ice/moist heat, home exercise therapy, and myofascial release. Follow up if symptoms worsen or do not improve for further therapy or imaging.       Relevant Medications   meloxicam (MOBIC) 15 MG tablet   methocarbamol (ROBAXIN) 500 MG tablet    Other Visit Diagnoses   None.      I am having Ms. Yuhas start on meloxicam and methocarbamol. I am  also having her maintain her SYNTHROID, busPIRone, and fluconazole.   Meds ordered this encounter  Medications  . meloxicam (MOBIC) 15 MG tablet    Sig: Take 0.5-1 tablets (7.5-15 mg total) by mouth daily as needed for pain.    Dispense:  30 tablet    Refill:  0    Order Specific Question:   Supervising Provider    Answer:   Pricilla Holm A [id number]  . methocarbamol (ROBAXIN) 500 MG tablet    Sig: Take 1 tablet (500 mg total) by mouth 4 (four) times daily.    Dispense:  90 tablet    Refill:  0    Order Specific Question:   Supervising Provider    Answer:   Pricilla Holm A [id number]     Follow-up: Return if symptoms worsen or fail to improve.  Mauricio Po, FNP

## 2016-07-20 NOTE — Patient Instructions (Signed)
Thank you for choosing [facility].  SUMMARY AND INSTRUCTIONS:  Medication:  Continue the meloxicam and robaxin.   Your prescription(s) have been submitted to your pharmacy or been printed and provided for you. Please take as directed and contact our office if you believe you are having problem(s) with the medication(s) or have any questions. .   Follow up:  If your symptoms worsen or fail to improve, please contact our office for further instruction, or in case of emergency go directly to the emergency room at the closest medical facility.     Piriformis Syndrome With Rehab Piriformis syndrome is a condition the affects the nervous system in the area of the hip, and is characterized by pain and possibly a loss of feeling in the backside (posterior) thigh that may extend down the entire length of the leg. The symptoms are caused by an increase in pressure on the sciatic nerve by the piriformis muscle, which is on the back of the hip and is responsible for externally rotating the hip. The sciatic nerve and its branches connect to much of the leg. Normally the sciatic nerve runs between the piriformis muscle and other muscles. However, in certain individuals the nerve runs through the muscle, which causes an increase in pressure on the nerve and results in the symptoms of piriformis syndrome. SYMPTOMS   Pain, tingling, numbness, or burning in the back of the thigh that may also extend down the entire leg.  Occasionally, tenderness in the buttock.  Loss of function of the leg.  Pain that worsens when using the piriformis muscle (running, jumping, or stairs).  Pain that increases with prolonged sitting.  Pain that is lessened by lying flat on the back. CAUSES   Piriformis syndrome is the result of an increase in pressure placed on the sciatic nerve. Oftentimes, piriformis syndrome is an overuse injury.  Stress placed on the nerve from a sudden increase in the intensity,  frequency, or duration of training.  Compensation of other extremity injuries. RISK INCREASES WITH:  Sports that involve the piriformis muscle (running, walking, or jumping).  You are born with (congenital) a defect in which the sciatic nerve passes through the muscle. PREVENTION  Warm up and stretch properly before activity.  Allow for adequate recovery between workouts.  Maintain physical fitness:  Strength, flexibility, and endurance.  Cardiovascular fitness. PROGNOSIS  If treated properly, the symptoms of piriformis syndrome usually resolve in 2 to 6 weeks. RELATED COMPLICATIONS   Persistent and possibly permanent pain and numbness in the lower extremity.  Weakness of the extremity that may progress to disability and inability to compete. TREATMENT  The most effective treatment for piriformis syndrome is rest from any activities that aggravate the symptoms. Ice and pain medication may help reduce pain and inflammation. The use of strengthening and stretching exercises may help reduce pain with activity. These exercises may be performed at home or with a therapist. A referral to a therapist may be given for further evaluation and treatment, such as ultrasound. Corticosteroid injections may be given to reduce inflammation that is causing pressure to be placed on the sciatic nerve. If nonsurgical (conservative) treatment is unsuccessful, then surgery may be recommended.  MEDICATION   If pain medication is necessary, then nonsteroidal anti-inflammatory medications, such as aspirin and ibuprofen, or other minor pain relievers, such as acetaminophen, are often recommended.  Do not take pain medication for 7 days before surgery.  Prescription pain relievers may be given if deemed necessary by your caregiver.  Use only as directed and only as much as you need.  Corticosteroid injections may be given by your caregiver. These injections should be reserved for the most serious cases,  because they may only be given a certain number of times. HEAT AND COLD:   Cold treatment (icing) relieves pain and reduces inflammation. Cold treatment should be applied for 10 to 15 minutes every 2 to 3 hours for inflammation and pain and immediately after any activity that aggravates your symptoms. Use ice packs or massage the area with a piece of ice (ice massage).  Heat treatment may be used prior to performing the stretching and strengthening activities prescribed by your caregiver, physical therapist, or athletic trainer. Use a heat pack or soak the injury in warm water. SEEK IMMEDIATE MEDICAL CARE IF:  Treatment seems to offer no benefit, or the condition worsens.  Any medications produce adverse side effects. EXERCISES RANGE OF MOTION (ROM) AND STRETCHING EXERCISES - Piriformis Syndrome These exercises may help you when beginning to rehabilitate your injury. Your symptoms may resolve with or without further involvement from your physician, physical therapist, or athletic trainer. While completing these exercises, remember:   Restoring tissue flexibility helps normal motion to return to the joints. This allows healthier, less painful movement and activity.  An effective stretch should be held for at least 30 seconds.  A stretch should never be painful. You should only feel a gentle lengthening or release in the stretched tissue. STRETCH - Hip Rotators  Lie on your back on a firm surface. Grasp your right / left knee with your right / left hand and your ankle with your opposite hand.  Keeping your hips and shoulders firmly planted, gently pull your right / left knee and rotate your lower leg toward your opposite shoulder until you feel a stretch in your buttocks.  Hold this stretch for __________ seconds. Repeat this stretch __________ times. Complete this stretch __________ times per day. STRETCH - Iliotibial Band  On the floor or bed, lie on your side so your right / left leg  is on top. Bend your knee and grab your ankle.  Slowly bring your knee back so that your thigh is in line with your trunk. Keep your heel at your buttocks and gently arch your back so your head, shoulders, and hips line up.  Slowly lower your leg so that your knee approaches the floor/bed until you feel a gentle stretch on the outside of your right / left thigh. If you do not feel a stretch and your knee will not fall farther, place the heel of your opposite foot on top of your knee and pull your thigh down farther.  Hold this stretch for __________ seconds. Repeat __________ times. Complete __________ times per day. STRENGTHENING EXERCISES - Piriformis Syndrome  These are some of the caregiver again or until your symptoms are resolved. Remember:   Strong muscles with good endurance tolerate stress better.  Do the exercises as initially prescribed by your caregiver. Progress slowly with each exercise, gradually increasing the number of repetitions and weight used under their guidance. STRENGTH - Hip Abductors, Straight Leg Raises Be aware of your form throughout the entire exercise so that you exercise the correct muscles. Sloppy form means that you are not strengthening the correct muscles.  Lie on your side so that your head, shoulders, knee, and hip line up. You may bend your lower knee to help maintain your balance. Your right / left leg should be on top.  Roll your hips slightly forward, so that your hips are stacked directly over each other and your right / left knee is facing forward.  Lift your top leg up 4-6 inches, leading with your heel. Be sure that your foot does not drift forward or that your knee does not roll toward the ceiling.  Hold this position for __________ seconds. You should feel the muscles in your outer hip lifting (you may not notice this until your leg begins to tire).  Slowly lower your leg to the starting position. Allow the muscles to fully relax before  beginning the next repetition. Repeat __________ times. Complete this exercise __________ times per day.  STRENGTH - Hip Abductors, Quadruped  On a firm, lightly padded surface, position yourself on your hands and knees. Your hands should be directly below your shoulders and your knees should be directly below your hips.  Keeping your right / left knee bent, lift your leg out to the side. Keep your legs level and in line with your shoulders.  Position yourself on your hands and knees.  Hold for __________ seconds.  Keeping your trunk steady and your hips level, slowly lower your leg to the starting position. Repeat __________ times. Complete this exercise __________ times per day.  STRENGTH - Hip Abductors, Standing  Tie one end of a rubber exercise band/tubing to a secure surface (table, pole) and tie a loop at the other end.  Place the loop around your right / left ankle. Keeping your ankle with the band directly opposite of the secured end, step away until there is tension in the tube/band.  Hold onto a chair as needed for balance.  Keeping your back upright, your shoulders over your hips, and your toes pointing forward, lift your right / left leg out to your side. Be sure to lift your leg with your hip muscles. Do not "throw" your leg or tip your body to lift your leg.  Slowly and with control, return to the starting position. Repeat exercise __________ times. Complete this exercise __________ times per day.    This information is not intended to replace advice given to you by your health care provider. Make sure you discuss any questions you have with your health care provider.   Document Released: 09/10/2005 Document Revised: 01/25/2015 Document Reviewed: 12/23/2008 Elsevier Interactive Patient Education [employer].

## 2016-07-20 NOTE — Assessment & Plan Note (Signed)
Symptoms and exam consistent with piriformis syndrome/muscle spasms starting from working with a patient. Treat conservatively with meloxicam and Robaxin. Recommend continued ice/moist heat, home exercise therapy, and myofascial release. Follow up if symptoms worsen or do not improve for further therapy or imaging.

## 2016-08-03 DIAGNOSIS — M9903 Segmental and somatic dysfunction of lumbar region: Secondary | ICD-10-CM | POA: Diagnosis not present

## 2016-08-03 DIAGNOSIS — M545 Low back pain: Secondary | ICD-10-CM | POA: Diagnosis not present

## 2016-08-03 DIAGNOSIS — M9901 Segmental and somatic dysfunction of cervical region: Secondary | ICD-10-CM | POA: Diagnosis not present

## 2016-08-03 DIAGNOSIS — M9904 Segmental and somatic dysfunction of sacral region: Secondary | ICD-10-CM | POA: Diagnosis not present

## 2016-09-03 ENCOUNTER — Encounter: Payer: Self-pay | Admitting: Obstetrics & Gynecology

## 2016-09-03 ENCOUNTER — Ambulatory Visit (INDEPENDENT_AMBULATORY_CARE_PROVIDER_SITE_OTHER): Payer: 59 | Admitting: Obstetrics & Gynecology

## 2016-09-03 VITALS — BP 114/74 | HR 86 | Ht 72.0 in | Wt 177.0 lb

## 2016-09-03 DIAGNOSIS — R102 Pelvic and perineal pain: Secondary | ICD-10-CM | POA: Diagnosis not present

## 2016-09-03 NOTE — Progress Notes (Signed)
   Subjective:    Patient ID: Chelsea Lindsey, female    DOB: [birth year], 40 y.o.   MRN: [id number]  HPI  40 yo MW lady here with 3 months of 2 periods per month. She had a 24 hour occasion last month when she had RLQ pain "like contractions". That pain has not come back.  Review of Systems In the past when she used OCPs, her periods were "almost nonexistent". Her husband has had a vasectomy. She has had her flu vaccine this season.    Objective:   Physical Exam   WNWHWFNAD Breathing, conversing, and ambulating normally Abd- benign Cervix, vagina normal Uterus - ULN size, somewhat boggy, c/w adenomyosis No palpable adnexal masses      Assessment & Plan:  DUB- check TSH, CBC, gyn u/s Preventative care- check Vit D

## 2016-09-03 NOTE — Progress Notes (Signed)
Patient complaining of periods coming every two weeks. Patient had some right sided pain with the start of there last period [id number]). Kathrene Alu RNBSN

## 2016-09-04 LAB — CBC
HCT: 38.2 % (ref 35.0–45.0)
Hemoglobin: 12.9 g/dL (ref 11.7–15.5)
MCH: 29.2 pg (ref 27.0–33.0)
MCHC: 33.8 g/dL (ref 32.0–36.0)
MCV: 86.4 fL (ref 80.0–100.0)
MPV: 9.5 fL (ref 7.5–12.5)
PLATELETS: 202 10*3/uL (ref 140–400)
RBC: 4.42 MIL/uL (ref 3.80–5.10)
RDW: 13.2 % (ref 11.0–15.0)
WBC: 5.7 10*3/uL (ref 3.8–10.8)

## 2016-09-04 LAB — TSH: TSH: 3.7 mIU/L

## 2016-09-04 LAB — VITAMIN D 25 HYDROXY (VIT D DEFICIENCY, FRACTURES): VIT D 25 HYDROXY: 38 ng/mL (ref 30–100)

## 2016-09-06 ENCOUNTER — Other Ambulatory Visit: Payer: 59

## 2016-09-06 ENCOUNTER — Ambulatory Visit (INDEPENDENT_AMBULATORY_CARE_PROVIDER_SITE_OTHER): Payer: 59

## 2016-09-06 DIAGNOSIS — N926 Irregular menstruation, unspecified: Secondary | ICD-10-CM

## 2016-09-06 DIAGNOSIS — R102 Pelvic and perineal pain: Secondary | ICD-10-CM

## 2016-09-06 DIAGNOSIS — H5213 Myopia, bilateral: Secondary | ICD-10-CM | POA: Diagnosis not present

## 2016-09-06 DIAGNOSIS — N920 Excessive and frequent menstruation with regular cycle: Secondary | ICD-10-CM | POA: Diagnosis not present

## 2016-09-06 DIAGNOSIS — H52223 Regular astigmatism, bilateral: Secondary | ICD-10-CM | POA: Diagnosis not present

## 2016-09-11 ENCOUNTER — Ambulatory Visit (INDEPENDENT_AMBULATORY_CARE_PROVIDER_SITE_OTHER): Payer: 59 | Admitting: Obstetrics & Gynecology

## 2016-09-11 ENCOUNTER — Encounter: Payer: Self-pay | Admitting: Obstetrics & Gynecology

## 2016-09-11 VITALS — BP 137/86 | HR 96 | Ht 72.0 in | Wt 176.0 lb

## 2016-09-11 DIAGNOSIS — N938 Other specified abnormal uterine and vaginal bleeding: Secondary | ICD-10-CM

## 2016-09-11 MED ORDER — LEVONORGEST-ETH ESTRAD 91-DAY 0.15-0.03 &0.01 MG PO TABS: 1.0000 | ORAL_TABLET | Freq: Every day | ORAL | 4 refills | Status: DC

## 2016-09-11 MED FILL — ASHLYNA 0.15-0.03-0.01 MG T: 0.15-0.03 & | 90 days supply | Qty: 91 | Fill #0

## 2016-09-11 NOTE — Progress Notes (Signed)
   Subjective:    Patient ID: Chelsea Lindsey, female    DOB: December 02, 1975, 40 y.o.   MRN: [id number]  HPI 40 yo MW lady here for follow up for labs/u/s as part of w/u for DUB. These studies were all normal.   Review of Systems     Objective:   Physical Exam WNWHWFNAD Breathing, conversing, and ambulating normally       Assessment & Plan:  DUB- no etiology established Rec extended cycle OCPs Camrese prescribed She can have 3 years worth of OCPs refills.

## 2016-10-09 MED FILL — SYNTHROID 100 MCG TABLET: 100 | 90 days supply | Qty: 90 | Fill #2

## 2016-10-10 ENCOUNTER — Ambulatory Visit: Payer: Self-pay | Admitting: Internal Medicine

## 2016-10-12 ENCOUNTER — Ambulatory Visit: Payer: Self-pay | Admitting: Internal Medicine

## 2016-10-22 ENCOUNTER — Encounter: Payer: Self-pay | Admitting: Internal Medicine

## 2016-10-22 ENCOUNTER — Ambulatory Visit (INDEPENDENT_AMBULATORY_CARE_PROVIDER_SITE_OTHER): Payer: 59 | Admitting: Internal Medicine

## 2016-10-22 ENCOUNTER — Ambulatory Visit (INDEPENDENT_AMBULATORY_CARE_PROVIDER_SITE_OTHER)
Admission: RE | Admit: 2016-10-22 | Discharge: 2016-10-22 | Disposition: A | Discharge status: Home / Self Care | Payer: 59 | Source: Ambulatory Visit | Attending: Internal Medicine | Admitting: Internal Medicine

## 2016-10-22 VITALS — BP 110/64 | HR 81 | Temp 98.1°F | Resp 14 | Ht 72.0 in | Wt 177.0 lb

## 2016-10-22 DIAGNOSIS — M545 Low back pain, unspecified: Secondary | ICD-10-CM

## 2016-10-22 DIAGNOSIS — G8929 Other chronic pain: Secondary | ICD-10-CM | POA: Insufficient documentation

## 2016-10-22 NOTE — Progress Notes (Signed)
   Subjective:    Patient ID: Chelsea Lindsey, female    DOB: 12-Sep-1976, 41 y.o.   MRN: [id number]  HPI The patient is a 41 YO female coming in for low back pain. 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 She then took NSAIDs and muscle relaxers for several weeks. She underwent physical therapy for about 1 month which helped some. Originally she had sciatica down the right leg which is now resolved. Pain is low lumbar around the SI joints and more right than left. She is frustrated by the lack of progress. Daily constant 2-3/10 pain with 8/10 with exertion. 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 Seeing a chiropractor but does not feel that it is helping. No imaging of this along the way.   Review of Systems  Constitutional: Positive for activity change. Negative for appetite change, fatigue, fever and unexpected weight change.  Respiratory: Negative.   Cardiovascular: Negative.   Gastrointestinal: Negative.   Musculoskeletal: Positive for arthralgias, back pain and myalgias. Negative for gait problem, joint swelling, neck pain and neck stiffness.  Skin: Negative.   Neurological: Negative.       Objective:   Physical Exam  Constitutional: She is oriented to person, place, and time. She appears well-developed and well-nourished.  HENT:  Head: Normocephalic and atraumatic.  Eyes: EOM are normal.  Cardiovascular: Normal rate and regular rhythm.   Pulmonary/Chest: Effort normal and breath sounds normal.  Abdominal: Soft.  Musculoskeletal: She exhibits tenderness.  Pain over the SI joint bilaterally but right greater than left.   Neurological: She is alert and oriented to person, place, and time.  Skin: Skin is warm and dry.   Vitals:   10/22/16 0816  BP: 110/64  Pulse: 81  Resp: 14  Temp: 98.1 F (36.7 C)  TempSrc: Oral  SpO2: 100%  Weight: 177 lb (80.3 kg)  Height: 6' (1.829 m)      Assessment & Plan:

## 2016-10-22 NOTE — Patient Instructions (Signed)
We will check the x-ray today and get you in with the sports medicine specialist to work on the back and tell you the problem with it.

## 2016-10-22 NOTE — Assessment & Plan Note (Addendum)
Checking x-ray today for extended duration of symptoms and has failed conservative management. Doing NSAIDs and limited muscle relaxers for 3 months now and completed physical therapy and chiropractor without relief. Referral to sports medicine as she wishes to stay more homeopathic with her treatment. Addendum: x-ray without cause so ordered MRI lumbar to evaluate further.

## 2016-10-22 NOTE — Progress Notes (Signed)
Pre visit review using our clinic review tool, if applicable. No additional management support is needed unless otherwise documented below in the visit note. 

## 2016-10-31 NOTE — Progress Notes (Signed)
[name] [address] [place], [place] [address] Phone: [phone] Subjective:    I'm seeing this patient by the request  of:  Hoyt Koch, MD  CC: Low back pain   [id number]  Chelsea Lindsey is a 41 y.o. female coming in with complaint of Low back pain. Patient states that she feels like she was hurts on the job. 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 Has been taking anti-inflammatories as well as muscle relaxers with minimal improvement. Hasn't done formal physical therapy without any significant improvement. Was having some radicular symptoms and continues to have some very intermittent radicular symptoms. This seems to be improving but unfortunately pain of the lower back in the sacroiliac joints does not. Seems to be worse in the right side than left. States then she continues to have a pain of 3 out of 10 in severity with regular activities and when she tries to exert herself 9 out of 10 pain. Has been seen a chiropractor but is not noticing any improvement. Denies any weakness of the lower cavity.   patient did have x-rays taken 10/22/2016. These were independently visualized by me. No cemented bony normality noted.  Past Medical History:  Diagnosis Date  . Cervical dysplasia    colposcopy  . GERD (gastroesophageal reflux disease)   . HPV in female   . Hypothyroid   . Post partum depression    Past Surgical History:  Procedure Laterality Date  . WISDOM TOOTH EXTRACTION     Social History   Social History  . Marital status: Married    Spouse name: N/A  . Number of children: N/A  . Years of education: N/A   Occupational History  . OT    Social History Main Topics  . Smoking status: Never Smoker  . Smokeless tobacco: Never Used  . Alcohol use 0.0 oz/week     Comment: Rarely  . Drug use: No  . Sexual activity: Yes    Partners: Male    Birth control/ protection: Surgical     Comment: husband  had vasectomy   Other Topics Concern  . None   Social History Narrative  . None   No Known Allergies Family History  Problem Relation Age of Onset  . Hypertension Mother   . Alcohol abuse Mother   . Mental illness Mother   . Arthritis Father   . Cancer Paternal Grandmother     breast  . Alcohol abuse Paternal Grandmother   . Arthritis Paternal Grandmother   . Hyperlipidemia Paternal Grandmother   . Cancer Maternal Grandmother     breast  . Alcohol abuse Maternal Grandmother   . Pancreatic cancer Paternal Grandfather   . Colon cancer Paternal Grandfather   . Alcohol abuse Paternal Grandfather   . Hyperlipidemia Paternal Grandfather   . Heart disease Paternal Grandfather   . Cancer Maternal Aunt     great aunt  . Alcohol abuse Maternal Grandfather   . Kidney disease Maternal Grandfather   . Diabetes Maternal Grandfather     Past medical history, social, surgical and family history all reviewed in electronic medical record.  No pertanent information unless stated regarding to the chief complaint.   Review of Systems:Review of systems updated and as accurate as of 11/01/16  No headache, visual changes, nausea, vomiting, diarrhea, constipation, dizziness, abdominal pain, skin rash, fevers, chills, night sweats, weight loss, swollen lymph nodes, body aches, joint swelling, muscle aches,  chest pain, shortness of breath, mood changes.   Objective  Blood pressure 110/82, pulse 82, height 6' (1.829 m), weight 177 lb (80.3 kg). Systems examined below as of 11/01/16   General: No apparent distress alert and oriented x3 mood and affect normal, dressed appropriately.  HEENT: Pupils equal, extraocular movements intact  Respiratory: Patient's speak in full sentences and does not appear short of breath  Cardiovascular: No lower extremity edema, non tender, no erythema  Skin: Warm dry intact with no signs of infection or rash on extremities or on axial skeleton.  Abdomen: Soft  nontender  Neuro: Cranial nerves II through XII are intact, neurovascularly intact in all extremities with 2+ DTRs and 2+ pulses.  Lymph: No lymphadenopathy of posterior or anterior cervical chain or axillae bilaterally.  Gait normal with good balance and coordination.  MSK:  Non tender with full range of motion and good stability and symmetric strength and tone of shoulders, elbows, wrist, hip, knee and ankles bilaterally.  Back Exam:  Inspection: Unremarkable  Motion: Flexion 35 deg, Extension 15 deg, Side Bending to 45 deg bilaterally,  Rotation to 45 deg bilaterally  SLR laying: Negative  XSLR laying: Negative  Palpable tenderness: Tender to palpation of the paraspinal musculature of the lumbar spine right greater than left.Marland Kitchen FABER: negative. Tightness bilaterally. Positive Stark on the right side Sensory change: Gross sensation intact to all lumbar and sacral dermatomes.  Reflexes: 2+ at both patellar tendons, 2+ at achilles tendons, Babinski's downgoing.  Strength at foot  Plantar-flexion: 5/5 Dorsi-flexion: 5/5 Eversion: 5/5 Inversion: 5/5  Leg strength  Quad: 5/5 Hamstring: 5/5 Hip flexor: 5/5 Hip abductors: 5/5  Gait unremarkable.  Osteopathic findings Cervical C2 flexed rotated and side bent right C4 flexed rotated and side bent left C6 flexed rotated and side bent left T3 extended rotated and side bent right inhaled third rib T9 extended rotated and side bent left L2 flexed rotated and side bent right Sacrum right on right Pelvic shift noted    Impression and Recommendations:     This case required medical decision making of moderate complexity.      Note: This dictation was prepared with Dragon dictation along with smaller phrase technology. Any transcriptional errors that result from this process are unintentional.

## 2016-11-01 ENCOUNTER — Ambulatory Visit (INDEPENDENT_AMBULATORY_CARE_PROVIDER_SITE_OTHER): Payer: 59 | Admitting: Family Medicine

## 2016-11-01 ENCOUNTER — Encounter: Payer: Self-pay | Admitting: Family Medicine

## 2016-11-01 DIAGNOSIS — M999 Biomechanical lesion, unspecified: Secondary | ICD-10-CM | POA: Insufficient documentation

## 2016-11-01 DIAGNOSIS — M533 Sacrococcygeal disorders, not elsewhere classified: Secondary | ICD-10-CM

## 2016-11-01 MED ORDER — VITAMIN D (ERGOCALCIFEROL) 1.25 MG (50000 UNIT) PO CAPS: 50000.0000 [IU] | ORAL_CAPSULE | ORAL | 0 refills | Status: DC

## 2016-11-01 MED ORDER — DICLOFENAC SODIUM 2 % TD SOLN: 2.0000 "application " | Freq: Two times a day (BID) | TRANSDERMAL | 3 refills | Status: AC

## 2016-11-01 MED FILL — VIT D2 1.25 MG (50,000 UNIT: 1.25 MG | 84 days supply | Qty: 12 | Fill #0

## 2016-11-01 NOTE — Assessment & Plan Note (Signed)
Decision today to treat with OMT was based on Physical Exam  After verbal consent patient was treated with HVLA, ME, FPR techniques in cervical, thoracic, lumbar and sacral and pelvisareas  Patient tolerated the procedure well with improvement in symptoms  Patient given exercises, stretches and lifestyle modifications  See medications in patient instructions if given  Patient will follow up in 3-4 weeks

## 2016-11-01 NOTE — Patient Instructions (Addendum)
Great to see you  Ice 20 minutes 2 times daily. Usually after activity and before bed. Exercises 3 times a week.  pennsaid pinkie amount topically 2 times daily as needed.  Once weekly vitamin D for next 12 weeks Make sure you are getting 65mg  of iron daily as well.  See me again in 3 weeks OK to bike or elliptical right now.

## 2016-11-01 NOTE — Assessment & Plan Note (Signed)
Sacroiliac Joint Mobilization and Rehab 1. Work on pretzel stretching, shoulder back and leg draped in front. 3-5 sets, 30 sec.. 2. hip abductor rotations. standing, hip flexion and rotation outward then inward. 3 sets, 15 reps. when can do comfortably, add ankle weights starting at 2 pounds.  3. cross over stretching - shoulder back to ground, same side leg crossover. 3-5 sets for 30 min..  4. rolling up and back knees to chest and rocking. 5. sacral tilt - 5 sets, hold for 5-10 seconds RTC in 4 weeks.  

## 2016-11-02 ENCOUNTER — Encounter: Payer: Self-pay | Admitting: Family Medicine

## 2016-11-10 ENCOUNTER — Ambulatory Visit
Admission: RE | Admit: 2016-11-10 | Discharge: 2016-11-10 | Disposition: A | Discharge status: Home / Self Care | Payer: 59 | Source: Ambulatory Visit | Attending: Internal Medicine | Admitting: Internal Medicine

## 2016-11-10 DIAGNOSIS — M545 Low back pain, unspecified: Secondary | ICD-10-CM

## 2016-11-10 DIAGNOSIS — G8929 Other chronic pain: Secondary | ICD-10-CM

## 2016-11-20 ENCOUNTER — Ambulatory Visit: Payer: 59 | Admitting: Family Medicine

## 2016-11-22 ENCOUNTER — Ambulatory Visit: Payer: 59 | Admitting: Family Medicine

## 2016-11-27 ENCOUNTER — Ambulatory Visit (INDEPENDENT_AMBULATORY_CARE_PROVIDER_SITE_OTHER): Payer: 59 | Admitting: Family Medicine

## 2016-11-27 ENCOUNTER — Encounter: Payer: Self-pay | Admitting: Family Medicine

## 2016-11-27 VITALS — BP 106/78 | HR 74 | Ht 72.0 in | Wt 178.0 lb

## 2016-11-27 DIAGNOSIS — M999 Biomechanical lesion, unspecified: Secondary | ICD-10-CM | POA: Diagnosis not present

## 2016-11-27 DIAGNOSIS — M533 Sacrococcygeal disorders, not elsewhere classified: Secondary | ICD-10-CM | POA: Diagnosis not present

## 2016-11-27 NOTE — Progress Notes (Signed)
[name] [address] [place], [place] [address] Phone: [phone] Subjective:    I'm seeing this patient by the request  of:  Hoyt Koch, MD  CC: Low back pain   [id number]  Chelsea Lindsey is a 41 y.o. female coming in with complaint of Low back pain. Patient initially was injured while she was lifting a patient. Has been feeling significantly better. Patient states that the manipulation that we did last time seemed to have her pain-free for a proximal wing one or 2 weeks and then the pain started coming back again. Patient denies any radiation down the legs or any numbness. Some mild uncomfortable feeling down the lower back again.   patient did have x-rays taken 10/22/2016. These were independently visualized by me.   Past Medical History:  Diagnosis Date  . Cervical dysplasia    colposcopy  . GERD (gastroesophageal reflux disease)   . HPV in female   . Hypothyroid   . Post partum depression    Past Surgical History:  Procedure Laterality Date  . WISDOM TOOTH EXTRACTION     Social History   Social History  . Marital status: Married    Spouse name: N/A  . Number of children: N/A  . Years of education: N/A   Occupational History  . OT    Social History Main Topics  . Smoking status: Never Smoker  . Smokeless tobacco: Never Used  . Alcohol use 0.0 oz/week     Comment: Rarely  . Drug use: No  . Sexual activity: Yes    Partners: Male    Birth control/ protection: Surgical     Comment: husband had vasectomy   Other Topics Concern  . None   Social History Narrative  . None   No Known Allergies Family History  Problem Relation Age of Onset  . Hypertension Mother   . Alcohol abuse Mother   . Mental illness Mother   . Arthritis Father   . Cancer Paternal Grandmother     breast  . Alcohol abuse Paternal Grandmother   . Arthritis Paternal Grandmother   . Hyperlipidemia Paternal Grandmother   . Cancer Maternal  Grandmother     breast  . Alcohol abuse Maternal Grandmother   . Pancreatic cancer Paternal Grandfather   . Colon cancer Paternal Grandfather   . Alcohol abuse Paternal Grandfather   . Hyperlipidemia Paternal Grandfather   . Heart disease Paternal Grandfather   . Cancer Maternal Aunt     great aunt  . Alcohol abuse Maternal Grandfather   . Kidney disease Maternal Grandfather   . Diabetes Maternal Grandfather     Past medical history, social, surgical and family history all reviewed in electronic medical record.  No pertanent information unless stated regarding to the chief complaint.   Review of Systems: No headache, visual changes, nausea, vomiting, diarrhea, constipation, dizziness, abdominal pain, skin rash, fevers, chills, night sweats, weight loss, swollen lymph nodes, body aches, joint swelling, muscle aches, chest pain, shortness of breath, mood changes.    Objective  Blood pressure 106/78, pulse 74, height 6' (1.829 m), weight 178 lb (80.7 kg).   Systems examined below as of 11/27/16 General: NAD A&O x3 mood, affect normal  HEENT: Pupils equal, extraocular movements intact no nystagmus Respiratory: not short of breath at rest or with speaking Cardiovascular: No lower extremity edema, non tender Skin: Warm dry intact with no signs of infection or rash on extremities or on axial skeleton. Abdomen:  Soft nontender, no masses Neuro: Cranial nerves  intact, neurovascularly intact in all extremities with 2+ DTRs and 2+ pulses. Lymph: No lymphadenopathy appreciated today  Gait normal with good balance and coordination.  MSK: Non tender with full range of motion and good stability and symmetric strength and tone of shoulders, elbows, wrist,  knee hips and ankles bilaterally.   Back Exam:  Inspection: Unremarkable  Motion: Flexion 45 deg, Extension 15 deg, Side Bending to 45 deg bilaterally,  Rotation to 45 deg bilaterally  SLR laying: Negative  XSLR laying: Negative  Palpable  tenderness: Mild tenderness still remaining of the paraspinal musculature of the lumbar spine on the right side FABER: negative. Tightness bilaterally. Positive Stark on the right side Sensory change: Gross sensation intact to all lumbar and sacral dermatomes.  Reflexes: 2+ at both patellar tendons, 2+ at achilles tendons, Babinski's downgoing.  Strength at foot  Plantar-flexion: 5/5 Dorsi-flexion: 5/5 Eversion: 5/5 Inversion: 5/5  Leg strength  Quad: 5/5 Hamstring: 5/5 Hip flexor: 5/5 Hip abductors: 5/5  Gait unremarkable.  Osteopathic findings Cervical C2 flexed rotated and side bent right T3 extended rotated and side bent right inhaled third rib T6 extended rotated and side bent left L2 flexed rotated and side bent right Sacrum right on right     Impression and Recommendations:     This case required medical decision making of moderate complexity.      Note: This dictation was prepared with Dragon dictation along with smaller phrase technology. Any transcriptional errors that result from this process are unintentional.

## 2016-11-27 NOTE — Patient Instructions (Signed)
6-8 weeks 

## 2016-11-27 NOTE — Assessment & Plan Note (Signed)
Overall much better. We discussed icing regimen and home exercises, we discussed core stability. We discussed which activities to do a which was potentially avoid. Patient will start increasing activity as tolerated. Patient come back and see me again in 6-8 weeks. No drastic changes in management

## 2016-11-27 NOTE — Assessment & Plan Note (Signed)
Decision today to treat with OMT was based on Physical Exam  After verbal consent patient was treated with HVLA, ME, FPR techniques in cervical, thoracic, lumbar and sacral areas  Patient tolerated the procedure well with improvement in symptoms  Patient given exercises, stretches and lifestyle modifications  See medications in patient instructions if given  Patient will follow up in 6-8 weeks 

## 2016-12-04 ENCOUNTER — Encounter: Payer: Self-pay | Admitting: Internal Medicine

## 2016-12-04 MED ORDER — HYDROCORTISONE 2.5 % RE CREA: 1.0000 "application " | TOPICAL_CREAM | Freq: Two times a day (BID) | RECTAL | 6 refills | Status: AC

## 2016-12-04 MED FILL — ASHLYNA 0.15-0.03-0.01 MG T: 0.15-0.03 & | 90 days supply | Qty: 91 | Fill #1

## 2017-01-07 NOTE — Progress Notes (Signed)
[name] [address] [place], [place] [address] Phone: [phone] Subjective:    I'm seeing this patient by the request  of:  Hoyt Koch, MD  CC: Low back pain   [id number]  Chelsea Lindsey is a 41 y.o. female coming in with complaint of Low back pain. Patient was having more of a discomfort in the low back. Patient was found and sacroiliac dysfunction. Responded very well to home exercises. Patient also responding well to manipulation and continue on the once weekly vitamin D. Patient states has noticed some association when she starts a three-month cycle of her birth control. An states that the further first 3 weeks increasing discomfort of the lower back. Patient states that overall she is doing better though with try to do the exercises. Try to stay active. Been monitoring her lifting mechanics.   patient did have x-rays taken 10/22/2016. These were independently visualized by me.   Past Medical History:  Diagnosis Date  . Cervical dysplasia    colposcopy  . GERD (gastroesophageal reflux disease)   . HPV in female   . Hypothyroid   . Post partum depression    Past Surgical History:  Procedure Laterality Date  . WISDOM TOOTH EXTRACTION     Social History   Social History  . Marital status: Married    Spouse name: N/A  . Number of children: N/A  . Years of education: N/A   Occupational History  . OT    Social History Main Topics  . Smoking status: Never Smoker  . Smokeless tobacco: Never Used  . Alcohol use 0.0 oz/week     Comment: Rarely  . Drug use: No  . Sexual activity: Yes    Partners: Male    Birth control/ protection: Surgical     Comment: husband had vasectomy   Other Topics Concern  . Not on file   Social History Narrative  . No narrative on file   No Known Allergies Family History  Problem Relation Age of Onset  . Hypertension Mother   . Alcohol abuse Mother   . Mental illness Mother   . Arthritis  Father   . Cancer Paternal Grandmother     breast  . Alcohol abuse Paternal Grandmother   . Arthritis Paternal Grandmother   . Hyperlipidemia Paternal Grandmother   . Cancer Maternal Grandmother     breast  . Alcohol abuse Maternal Grandmother   . Pancreatic cancer Paternal Grandfather   . Colon cancer Paternal Grandfather   . Alcohol abuse Paternal Grandfather   . Hyperlipidemia Paternal Grandfather   . Heart disease Paternal Grandfather   . Cancer Maternal Aunt     great aunt  . Alcohol abuse Maternal Grandfather   . Kidney disease Maternal Grandfather   . Diabetes Maternal Grandfather     Past medical history, social, surgical and family history all reviewed in electronic medical record.  No pertanent information unless stated regarding to the chief complaint.   Review of Systems: No headache, visual changes, nausea, vomiting, diarrhea, constipation, dizziness, abdominal pain, skin rash, fevers, chills, night sweats, weight loss, swollen lymph nodes, body aches, joint swelling, muscle aches, chest pain, shortness of breath, mood changes.     Objective  There were no vitals taken for this visit.   Systems examined below as of 01/08/17 General: NAD A&O x3 mood, affect normal  HEENT: Pupils equal, extraocular movements intact no nystagmus Respiratory: not short of breath at rest or with  speaking Cardiovascular: No lower extremity edema, non tender Skin: Warm dry intact with no signs of infection or rash on extremities or on axial skeleton. Abdomen: Soft nontender, no masses Neuro: Cranial nerves  intact, neurovascularly intact in all extremities with 2+ DTRs and 2+ pulses. Lymph: No lymphadenopathy appreciated today  Gait normal with good balance and coordination.  MSK: Non tender with full range of motion and good stability and symmetric strength and tone of shoulders, elbows, wrist,  knee hips and ankles bilaterally.    Back Exam:  Inspection: Unremarkable  Motion:  Flexion 45 deg, Extension 15 deg, Side Bending to 35 deg bilaterally,  Rotation to 35 deg bilaterally  SLR laying: Negative  XSLR laying: Negative  Palpable tenderness: Increasing tenderness over the left sacroiliac joint and the left scapular region FABER: negative. Negative Starke test which is improved Reflexes: 2+ at both patellar tendons, 2+ at achilles tendons, Babinski's downgoing.  Strength at foot  Plantar-flexion: 5/5 Dorsi-flexion: 5/5 Eversion: 5/5 Inversion: 5/5  Leg strength  Quad: 5/5 Hamstring: 5/5 Hip flexor: 5/5 Hip abductors: 5/5  Gait unremarkable.  Osteopathic findings Cervical C2 flexed rotated and side bent right C4 flexed rotated and side bent left C6 flexed rotated and side bent left T3 extended rotated and side bent right inhaled third rib T9 extended rotated and side bent left L2 flexed rotated and side bent right Sacrum right on right      Impression and Recommendations:     This case required medical decision making of moderate complexity.      Note: This dictation was prepared with Dragon dictation along with smaller phrase technology. Any transcriptional errors that result from this process are unintentional.

## 2017-01-08 ENCOUNTER — Ambulatory Visit (INDEPENDENT_AMBULATORY_CARE_PROVIDER_SITE_OTHER): Payer: 59 | Admitting: Family Medicine

## 2017-01-08 ENCOUNTER — Encounter: Payer: Self-pay | Admitting: Family Medicine

## 2017-01-08 VITALS — BP 110/82 | HR 64 | Resp 16 | Wt 178.4 lb

## 2017-01-08 DIAGNOSIS — M999 Biomechanical lesion, unspecified: Secondary | ICD-10-CM | POA: Diagnosis not present

## 2017-01-08 DIAGNOSIS — M533 Sacrococcygeal disorders, not elsewhere classified: Secondary | ICD-10-CM | POA: Diagnosis not present

## 2017-01-08 MED FILL — SYNTHROID 100 MCG TABLET: 100 | 90 days supply | Qty: 90 | Fill #3

## 2017-01-08 NOTE — Assessment & Plan Note (Signed)
Overall doing well. Possible pain back pain with patient's birth control. Discussed with patient at great length about possible changes in medication or with the use of case patient is having this pain. We discussed icing regimen and home exercises. We discussed which activities to avoid. Discussed core stability in posture. Follow-up again in 6 weeks.

## 2017-01-08 NOTE — Patient Instructions (Addendum)
God to see you  Ice 20 minutes 2 times daily. Usually after activity and before bed. On wall with heels, butt shoulder and head touching for a goal of 5 minutes daily  Keep being active Tennis ball between shoulder blades Continue the vitamin D for now or can go to 2000 IU daily  See me again in 6 weeks.

## 2017-01-08 NOTE — Assessment & Plan Note (Signed)
Decision today to treat with OMT was based on Physical Exam  After verbal consent patient was treated with HVLA, ME, FPR techniques in cervical, thoracic, lumbar and sacral areas  Patient tolerated the procedure well with improvement in symptoms  Patient given exercises, stretches and lifestyle modifications  See medications in patient instructions if given  Patient will follow up in 6 weeks 

## 2017-01-08 NOTE — Progress Notes (Signed)
Pre-visit discussion using our clinic review tool. No additional management support is needed unless otherwise documented below in the visit note.  

## 2017-02-11 ENCOUNTER — Encounter: Payer: Self-pay | Admitting: Obstetrics & Gynecology

## 2017-02-12 ENCOUNTER — Encounter: Payer: Self-pay | Admitting: Obstetrics & Gynecology

## 2017-02-12 ENCOUNTER — Ambulatory Visit (INDEPENDENT_AMBULATORY_CARE_PROVIDER_SITE_OTHER): Payer: 59 | Admitting: Obstetrics & Gynecology

## 2017-02-12 VITALS — BP 116/72 | HR 78 | Resp 16 | Ht 72.0 in | Wt 178.0 lb

## 2017-02-12 DIAGNOSIS — Z1151 Encounter for screening for human papillomavirus (HPV): Secondary | ICD-10-CM

## 2017-02-12 DIAGNOSIS — Z113 Encounter for screening for infections with a predominantly sexual mode of transmission: Secondary | ICD-10-CM | POA: Diagnosis not present

## 2017-02-12 DIAGNOSIS — Z124 Encounter for screening for malignant neoplasm of cervix: Secondary | ICD-10-CM | POA: Diagnosis not present

## 2017-02-12 DIAGNOSIS — N938 Other specified abnormal uterine and vaginal bleeding: Secondary | ICD-10-CM

## 2017-02-12 DIAGNOSIS — Z01419 Encounter for gynecological examination (general) (routine) without abnormal findings: Secondary | ICD-10-CM

## 2017-02-12 MED ORDER — LEVONORGEST-ETH ESTRAD 91-DAY 0.15-0.03 &0.01 MG PO TABS: 1.0000 | ORAL_TABLET | Freq: Every day | ORAL | 4 refills | Status: AC

## 2017-02-12 NOTE — Progress Notes (Signed)
   Subjective:    Patient ID: Chelsea Lindsey, female    DOB: [birth year], 41 y.o.   MRN: [id number]  HPI 41 yo MW P2 (4 and 40 yo kids) here today with the issue of BTB and back pain. She has seen a DO, PT, and chiro for the back pain. This started when she had an accident at work [facility]).  She has noticed that the BTB on OCPs ( She is doing extended cycle packs)is more heavy when the back pain is present. The pain is intermittent. Her actual last real period was 9/17.    Review of Systems Husband has had a vasectomy. TSH normal 12/17 She is moving to [place] next month    Objective:   Physical Exam [id number] Breathing, conversing, and ambulating normally Abd- benign NSSR, NT, mobile, no adnexal masses or tenderness       Assessment & Plan:  BTB on continuous OCPs- she tried the doubling up last week, bleeding stopped but now has returned Rec stay off OCPs for 7 days and then restart Regarding the LBP, I cannot say for sure if this is due to her suspected adenomyosis versus residual from her back injury. To be thorough, I will check cervical cultures

## 2017-02-14 LAB — GC/CHLAMYDIA PROBE AMP (~~LOC~~) NOT AT ARMC
Chlamydia: NEGATIVE
Neisseria Gonorrhea: NEGATIVE

## 2017-02-14 MED FILL — ASHLYNA 0.15-0.03-0.01 MG T: 0.15-0.03 & | 90 days supply | Qty: 91 | Fill #2 | Status: TO

## 2017-02-15 ENCOUNTER — Ambulatory Visit (INDEPENDENT_AMBULATORY_CARE_PROVIDER_SITE_OTHER): Payer: 59 | Admitting: Sports Medicine

## 2017-02-15 ENCOUNTER — Encounter: Payer: Self-pay | Admitting: Sports Medicine

## 2017-02-15 VITALS — BP 102/76 | HR 85 | Ht 72.0 in | Wt 177.0 lb

## 2017-02-15 DIAGNOSIS — M9905 Segmental and somatic dysfunction of pelvic region: Secondary | ICD-10-CM

## 2017-02-15 DIAGNOSIS — M9903 Segmental and somatic dysfunction of lumbar region: Secondary | ICD-10-CM

## 2017-02-15 DIAGNOSIS — G8929 Other chronic pain: Secondary | ICD-10-CM

## 2017-02-15 DIAGNOSIS — M9901 Segmental and somatic dysfunction of cervical region: Secondary | ICD-10-CM | POA: Diagnosis not present

## 2017-02-15 DIAGNOSIS — M542 Cervicalgia: Secondary | ICD-10-CM | POA: Diagnosis not present

## 2017-02-15 DIAGNOSIS — M545 Low back pain: Secondary | ICD-10-CM | POA: Diagnosis not present

## 2017-02-15 DIAGNOSIS — M9902 Segmental and somatic dysfunction of thoracic region: Secondary | ICD-10-CM | POA: Diagnosis not present

## 2017-02-15 NOTE — Progress Notes (Addendum)
OFFICE VISIT NOTE Chelsea Lindsey, Chelsea [facility] at [facility] - 41 y.o. female MRN [id number]  Date of birth: November 07, 1975  Visit Date: 02/15/2017  PCP: Hoyt Koch, MD   Referred by: Gayla Doss, CMA acting as scribe for Dr. Paulla Fore.  SUBJECTIVE:   Chief Complaint  Patient presents with  . pain in neck   HPI: As below and per problem based documentation when appropriate.  Pt presents today with complaint of neck pain. Pain started Wednesday morning.  Pt was drying her hair and when she flipped her head over to felt something pop and her neck got stiff. She reports pain and swelling in the back of her neck.   The pain is described as sharp throbbing pain and is rated as 4/10.  Worsened with swallowing, looking down Improves with stretches Therapies tried include Aleve and pt has gotten some relief.   Other associated symptoms include: pain in trapezius  Pt denies fever, chills, night sweats.     Review of Systems  Constitutional: Negative for chills and fever.  HENT: Negative.   Eyes: Negative.   Respiratory: Negative for shortness of breath and wheezing.   Cardiovascular: Negative for chest pain and palpitations.  Gastrointestinal: Negative.   Genitourinary: Negative.   Musculoskeletal: Positive for myalgias and neck pain. Negative for falls.  Skin: Negative.   Neurological: Negative for dizziness, tingling and headaches.  Endo/Heme/Allergies: Does not bruise/bleed easily.    Otherwise per HPI.  HISTORY & PERTINENT PRIOR DATA:  No specialty comments available. She reports that she has never smoked. She has never used smokeless tobacco. No results for input(s): HGBA1C, LABURIC in the last 8760 hours. Medications & Allergies reviewed per EMR Patient Active Problem List   Diagnosis Date Noted  . SI (sacroiliac) joint dysfunction 11/01/2016  . Nonallopathic lesion  of sacral region 11/01/2016  . Nonallopathic lesion of pelvic region 11/01/2016  . Nonallopathic lesion of lumbosacral region 11/01/2016  . Chronic bilateral low back pain without sciatica 10/22/2016  . Piriformis muscle pain 07/20/2016  . Yeast infection 07/03/2016  . Routine general medical examination at a health care facility 05/04/2016  . Hypothyroidism 04/28/2015  . Post partum depression 04/28/2015   Past Medical History:  Diagnosis Date  . Cervical dysplasia    colposcopy  . GERD (gastroesophageal reflux disease)   . HPV in female   . Hypothyroid   . Post partum depression    Family History  Problem Relation Age of Onset  . Hypertension Mother   . Alcohol abuse Mother   . Mental illness Mother   . Arthritis Father   . Cancer Paternal Grandmother        breast  . Alcohol abuse Paternal Grandmother   . Arthritis Paternal Grandmother   . Hyperlipidemia Paternal Grandmother   . Cancer Maternal Grandmother        breast  . Alcohol abuse Maternal Grandmother   . Pancreatic cancer Paternal Grandfather   . Colon cancer Paternal Grandfather   . Alcohol abuse Paternal Grandfather   . Hyperlipidemia Paternal Grandfather   . Heart disease Paternal Grandfather   . Cancer Maternal Aunt        great aunt  . Alcohol abuse Maternal Grandfather   . Kidney disease Maternal Grandfather   . Diabetes Maternal Grandfather    Past Surgical History:  Procedure Laterality Date  . WISDOM TOOTH EXTRACTION  Social History   Occupational History  . OT    Social History Main Topics  . Smoking status: Never Smoker  . Smokeless tobacco: Never Used  . Alcohol use 0.0 oz/week     Comment: Rarely  . Drug use: No  . Sexual activity: Yes    Partners: Male    Birth control/ protection: Surgical     Comment: husband had vasectomy    OBJECTIVE:  VS:  HT:6' (182.9 cm)   WT:177 lb (80.3 kg)  BMI:24.1    BP:102/76  HR:85bpm  TEMP: ( )  RESP:99 % EXAM: Findings:  WDWN, NAD,  Non-toxic appearing Alert & appropriately interactive Not depressed or anxious appearing No increased work of breathing. Pupils are equal. EOM intact without nystagmus No clubbing or cyanosis of the extremities appreciated No significant rashes/lesions/ulcerations overlying the examined area. Radial pulses 2+/4.  No significant generalized UE edema. Sensation intact to light touch in upper extremities.  Neck exam: Well aligned.  Slight decreased rotation to right. Upper extremity strength is intact light touch.  No significant pain with radial plexus squeeze her arm squeeze test.  Pain is worse with full forward flexion.  Back: Generalized tenderness to palpation along paraspinal musculature.  She has midthoracic pain.  Negative straight leg raise.  Lower extremity strength intact.  OSTEOPATHIC/STRUCTURAL EXAM:   OA rotated right C2 through C4 FRS left C7 extended rotated right T2 through T6 neutral rotated right L3 FRS right Right anterior innominate      No results found. ASSESSMENT & PLAN:   Problem List Items Addressed This Visit    Chronic bilateral low back pain without sciatica    Neck and back pain associated with somatic dysfunctions.  She has responded well to osteopathic treatments in the past will go ahead and repeat that today. additionally therapeutic exercises working on posterior chain recruitment with neuromuscular control focus in detail with the athletic training staff.  +++++++++++++++++++++++++++++++++++++++++++++++++++++++++++++++ PROCEDURE NOTE: THERAPEUTIC EXERCISES (97110) 15 minutes spent for Therapeutic exercises as stated in above notes.  This included exercises focusing on stretching, strengthening, with significant focus on eccentric aspects.   Proper technique shown and discussed handout in great detail with ATC.  All questions were discussed and answered.   ++++++++++++++++++++++++++++++++++++++++++++ PROCEDURE NOTE : OSTEOPATHIC MANIPULATION The  decision today to treat with Osteopathic Manipulative Therapy (OMT) was based on physical exam findings. Verbal consent was obtained after after explanation of risks, benefits and potential side effects, including acute pain flare, post manipulation soreness and need for repeat treatments.  If Cervical manipulation was performed additional time was spent discussing the associated minimal risk of  injury to neurovascular structures.  After consent was obtained manipulation was performed as below:            Regions treated:  Per billing codes          Techniques used:  Direct, Muscle Energy, MFR and HVLA The patient tolerated the treatment well and reported Improved symptoms following treatment today. Patient was given medications, exercises, stretches and lifestyle modifications per AVS and verbally.          Other Visit Diagnoses    Neck pain    -  Primary   Somatic dysfunction of cervical region       Somatic dysfunction of thoracic region       Somatic dysfunction of lumbar region       Somatic dysfunction of pelvis region          Follow-up: Return if  symptoms worsen or fail to improve.   CMA/ATC served as Education administrator during this visit. History, Physical, and Plan performed by medical provider. Documentation and orders reviewed and attested to.      Teresa Coombs, [facility] Physician

## 2017-02-15 NOTE — Patient Instructions (Signed)
Please perform the exercise program that Chelsea Lindsey has prepared for you and gone over in detail on a daily basis.  In addition to the handout you were provided you can access your program through: [email or web address]   Your unique program code is: [email or web address]

## 2017-02-19 ENCOUNTER — Ambulatory Visit: Payer: 59 | Admitting: Family Medicine

## 2017-02-24 NOTE — Assessment & Plan Note (Signed)
Neck and back pain associated with somatic dysfunctions.  She has responded well to osteopathic treatments in the past will go ahead and repeat that today. additionally therapeutic exercises working on posterior chain recruitment with neuromuscular control focus in detail with the athletic training staff.  +++++++++++++++++++++++++++++++++++++++++++++++++++++++++++++++ PROCEDURE NOTE: THERAPEUTIC EXERCISES (97110) 15 minutes spent for Therapeutic exercises as stated in above notes.  This included exercises focusing on stretching, strengthening, with significant focus on eccentric aspects.   Proper technique shown and discussed handout in great detail with ATC.  All questions were discussed and answered.   ++++++++++++++++++++++++++++++++++++++++++++ PROCEDURE NOTE : OSTEOPATHIC MANIPULATION The decision today to treat with Osteopathic Manipulative Therapy (OMT) was based on physical exam findings. Verbal consent was obtained after after explanation of risks, benefits and potential side effects, including acute pain flare, post manipulation soreness and need for repeat treatments.  If Cervical manipulation was performed additional time was spent discussing the associated minimal risk of  injury to neurovascular structures.  After consent was obtained manipulation was performed as below:            Regions treated:  Per billing codes          Techniques used:  Direct, Muscle Energy, MFR and HVLA The patient tolerated the treatment well and reported Improved symptoms following treatment today. Patient was given medications, exercises, stretches and lifestyle modifications per AVS and verbally.

## 2017-03-07 ENCOUNTER — Encounter: Payer: Self-pay | Admitting: Family Medicine

## 2017-03-07 ENCOUNTER — Ambulatory Visit (INDEPENDENT_AMBULATORY_CARE_PROVIDER_SITE_OTHER): Payer: 59 | Admitting: Family Medicine

## 2017-03-07 VITALS — BP 104/72 | HR 80 | Ht 72.0 in | Wt 175.0 lb

## 2017-03-07 DIAGNOSIS — M999 Biomechanical lesion, unspecified: Secondary | ICD-10-CM | POA: Diagnosis not present

## 2017-03-07 DIAGNOSIS — M2550 Pain in unspecified joint: Secondary | ICD-10-CM | POA: Diagnosis not present

## 2017-03-07 DIAGNOSIS — M533 Sacrococcygeal disorders, not elsewhere classified: Secondary | ICD-10-CM

## 2017-03-07 NOTE — Assessment & Plan Note (Addendum)
Decision today to treat with OMT was based on Physical Exam  After verbal consent patient was treated with HVLA, ME, FPR techniques in cervical, thoracic, lumbar and sacral pelvis areas  Patient tolerated the procedure well with improvement in symptoms  Patient given exercises, stretches and lifestyle modifications  See medications in patient instructions if given  Patient will follow up prn

## 2017-03-07 NOTE — Progress Notes (Signed)
[facility] [address], [place] [address] Phone: [phone] Subjective:    I'm seeing this patient by the request  of:  Hoyt Koch, MD  CC: Low back pain f/u  LPF:[id number]  Chelsea Lindsey is a 41 y.o. female coming in with complaint of Low back pain. Patient was having more of a discomfort in the low back. Patient was found and sacroiliac dysfunction. Patient is doing fairly well with conservative therapy. States that her low back seems to be doing somewhat well. Still has some stiffness. Was having some neck pain and headaches since on the provider which seemed to be beneficial. Has always responded very well to manipulation. Patient is moving out of town.   patient did have x-rays taken 10/22/2016. These were independently visualized by me.   Past Medical History:  Diagnosis Date  . Cervical dysplasia    colposcopy  . GERD (gastroesophageal reflux disease)   . HPV in female   . Hypothyroid   . Post partum depression    Past Surgical History:  Procedure Laterality Date  . WISDOM TOOTH EXTRACTION     Social History   Social History  . Marital status: Married    Spouse name: N/A  . Number of children: N/A  . Years of education: N/A   Occupational History  . OT    Social History Main Topics  . Smoking status: Never Smoker  . Smokeless tobacco: Never Used  . Alcohol use 0.0 oz/week     Comment: Rarely  . Drug use: No  . Sexual activity: Yes    Partners: Male    Birth control/ protection: Surgical     Comment: husband had vasectomy   Other Topics Concern  . None   Social History Narrative  . None   No Known Allergies Family History  Problem Relation Age of Onset  . Hypertension Mother   . Alcohol abuse Mother   . Mental illness Mother   . Arthritis Father   . Cancer Paternal Grandmother        breast  . Alcohol abuse Paternal Grandmother   . Arthritis Paternal Grandmother   . Hyperlipidemia Paternal  Grandmother   . Cancer Maternal Grandmother        breast  . Alcohol abuse Maternal Grandmother   . Pancreatic cancer Paternal Grandfather   . Colon cancer Paternal Grandfather   . Alcohol abuse Paternal Grandfather   . Hyperlipidemia Paternal Grandfather   . Heart disease Paternal Grandfather   . Cancer Maternal Aunt        great aunt  . Alcohol abuse Maternal Grandfather   . Kidney disease Maternal Grandfather   . Diabetes Maternal Grandfather     Past medical history, social, surgical and family history all reviewed in electronic medical record.  No pertanent information unless stated regarding to the chief complaint.   Review of Systems: No headache, visual changes, nausea, vomiting, diarrhea, constipation, dizziness, abdominal pain, skin rash, fevers, chills, night sweats, weight loss, swollen lymph nodes, body aches, joint swelling, , chest pain, shortness of breath, mood changes.  Positive muscle aches   Objective  Blood pressure 104/72, pulse 80, height 6' (1.829 m), weight 175 lb (79.4 kg).   Systems examined below as of 03/07/17 General: NAD A&O x3 mood, affect normal  HEENT: Pupils equal, extraocular movements intact no nystagmus Respiratory: not short of breath at rest or with speaking Cardiovascular: No lower extremity edema, non tender Skin: Warm dry  intact with no signs of infection or rash on extremities or on axial skeleton. Abdomen: Soft nontender, no masses Neuro: Cranial nerves  intact, neurovascularly intact in all extremities with 2+ DTRs and 2+ pulses. Lymph: No lymphadenopathy appreciated today  Gait normal with good balance and coordination.  MSK: Non tender with full range of motion and good stability and symmetric strength and tone of shoulders, elbows, wrist,  knee hips and ankles bilaterally.  Hypermobility syndrome 7-8 Back Exam:  Inspection: Unremarkable  Motion: Flexion 35 deg, Extension 25 deg, Side Bending to 35 deg bilaterally,  Rotation to 45  deg bilaterally  SLR laying: Negative  XSLR laying: Negative  Palpable tenderness: Tender to palpation paraspinal musculature lumbar spine mostly at the thoracolumbar juncture some pain over the sacroiliac joint.Marland Kitchen FABER: negative. Sensory change: Gross sensation intact to all lumbar and sacral dermatomes.  Reflexes: 2+ at both patellar tendons, 2+ at achilles tendons, Babinski's downgoing.  Strength at foot  Plantar-flexion: 5/5 Dorsi-flexion: 5/5 Eversion: 5/5 Inversion: 5/5  Leg strength  Quad: 5/5 Hamstring: 5/5 Hip flexor: 5/5 Hip abductors: 5/5  Gait unremarkable.  Osteopathic findings C2 flexed rotated and side bent right C4 flexed rotated and side bent left T3 extended rotated and side bent right inhaled third rib T6 extended rotated and side bent left L1 flexed rotated and side bent right Sacrum right on right Pelvic shear left      Impression and Recommendations:     This case required medical decision making of moderate complexity.      Note: This dictation was prepared with Dragon dictation along with smaller phrase technology. Any transcriptional errors that result from this process are unintentional.

## 2017-03-07 NOTE — Assessment & Plan Note (Signed)
Discussed with patient that she does have signs of hypermobility syndrome. Some characteristics that are concerning for Erhls Danlos syndrome or Marfan's. We discussed the possibility of further workup which patient declined after we discussed that there would be no significant change in management at this time.

## 2017-03-07 NOTE — Assessment & Plan Note (Signed)
Continues to be very difficult overall. Patient has made significant progress overall. We discussed icing regimen, topical anti-inflammatories. Given medications for any breakthrough pain. Patient will be moving and we will look for another provider. Follow-up again as needed.

## 2017-04-05 ENCOUNTER — Telehealth: Payer: Self-pay | Admitting: Internal Medicine

## 2017-04-05 DIAGNOSIS — E039 Hypothyroidism, unspecified: Secondary | ICD-10-CM

## 2017-04-05 MED ORDER — SYNTHROID 100 MCG PO TABS: 100.0000 ug | ORAL_TABLET | Freq: Every day | ORAL | 0 refills | Status: DC

## 2017-04-05 NOTE — Telephone Encounter (Signed)
Chelsea Lindsey, patient is out of this med.  Can you look at refilling?

## 2017-04-05 NOTE — Telephone Encounter (Signed)
Pt called wanting a refill of SYNTHROID 100 MCG tablet  She has moved to [place] and will be getting a new PCP  [facility] [employer]

## 2017-04-05 NOTE — Telephone Encounter (Signed)
rx cancel from [facility], new rx sent to [facility].

## 2017-04-05 NOTE — Telephone Encounter (Signed)
Left patient vm of refill sent and that she would need to find new PCP before this med ran out.

## 2017-04-16 ENCOUNTER — Encounter: Payer: Self-pay | Admitting: Obstetrics & Gynecology

## 2017-05-01 ENCOUNTER — Other Ambulatory Visit: Payer: Self-pay | Admitting: Nurse Practitioner

## 2017-05-01 DIAGNOSIS — E039 Hypothyroidism, unspecified: Secondary | ICD-10-CM

## 2017-05-02 MED ORDER — SYNTHROID 100 MCG PO TABS: 100.0000 ug | ORAL_TABLET | Freq: Every day | ORAL | 0 refills | Status: AC

## 2017-05-02 NOTE — Telephone Encounter (Signed)
This pt has moved to [place] and can not get in to see a primary Dr til Aug 29th.  She is completely out of her Thyroid meds and needs to see if Baldo Ash can call in enough to make it to her appt?

## 2017-05-02 NOTE — Telephone Encounter (Signed)
Responded back to pt via email that she sent. Sent 30 day supply until she is able to see new provider.../lm,b
